# Patient Record
Sex: Male | Born: 1955 | Race: White | Hispanic: No | Marital: Single
Health system: Southern US, Community
[De-identification: ages and names within clinical notes are randomized; demographics above are authoritative.]

## PROBLEM LIST (undated history)

## (undated) DIAGNOSIS — J690 Pneumonitis due to inhalation of food and vomit: Secondary | ICD-10-CM

## (undated) DIAGNOSIS — G931 Anoxic brain damage, not elsewhere classified: Secondary | ICD-10-CM

## (undated) DIAGNOSIS — I469 Cardiac arrest, cause unspecified: Secondary | ICD-10-CM

## (undated) DIAGNOSIS — J9621 Acute and chronic respiratory failure with hypoxia: Secondary | ICD-10-CM

---

## 2020-10-19 ENCOUNTER — Inpatient Hospital Stay
Admission: RE | Admit: 2020-10-19 | Discharge: 2020-11-25 | Disposition: A | Discharge status: Home / Self Care | Payer: Medicare (Managed Care) | Source: Other Acute Inpatient Hospital | Attending: Internal Medicine | Admitting: Internal Medicine

## 2020-10-19 ENCOUNTER — Other Ambulatory Visit (HOSPITAL_COMMUNITY): Payer: Medicare (Managed Care)

## 2020-10-19 DIAGNOSIS — R509 Fever, unspecified: Secondary | ICD-10-CM

## 2020-10-19 DIAGNOSIS — J9690 Respiratory failure, unspecified, unspecified whether with hypoxia or hypercapnia: Secondary | ICD-10-CM

## 2020-10-19 DIAGNOSIS — G931 Anoxic brain damage, not elsewhere classified: Secondary | ICD-10-CM | POA: Diagnosis present

## 2020-10-19 DIAGNOSIS — Z452 Encounter for adjustment and management of vascular access device: Secondary | ICD-10-CM

## 2020-10-19 DIAGNOSIS — I469 Cardiac arrest, cause unspecified: Secondary | ICD-10-CM | POA: Diagnosis present

## 2020-10-19 DIAGNOSIS — I509 Heart failure, unspecified: Secondary | ICD-10-CM

## 2020-10-19 DIAGNOSIS — Z431 Encounter for attention to gastrostomy: Secondary | ICD-10-CM

## 2020-10-19 DIAGNOSIS — J690 Pneumonitis due to inhalation of food and vomit: Secondary | ICD-10-CM | POA: Diagnosis present

## 2020-10-19 DIAGNOSIS — J189 Pneumonia, unspecified organism: Secondary | ICD-10-CM

## 2020-10-19 DIAGNOSIS — J9621 Acute and chronic respiratory failure with hypoxia: Secondary | ICD-10-CM | POA: Diagnosis present

## 2020-10-19 HISTORY — DX: Anoxic brain damage, not elsewhere classified: G93.1

## 2020-10-19 HISTORY — DX: Cardiac arrest, cause unspecified: I46.9

## 2020-10-19 HISTORY — DX: Acute and chronic respiratory failure with hypoxia: J96.21

## 2020-10-19 HISTORY — DX: Pneumonitis due to inhalation of food and vomit: J69.0

## 2020-10-19 LAB — URINALYSIS, ROUTINE W REFLEX MICROSCOPIC
Bacteria, UA: NONE SEEN
Bilirubin Urine: NEGATIVE
Glucose, UA: NEGATIVE mg/dL
Hgb urine dipstick: NEGATIVE
Ketones, ur: 5 mg/dL — AB
Leukocytes,Ua: NEGATIVE
Nitrite: NEGATIVE
Protein, ur: 100 mg/dL — AB
Specific Gravity, Urine: 1.02 (ref 1.005–1.030)
pH: 7 (ref 5.0–8.0)

## 2020-10-19 LAB — BLOOD GAS, ARTERIAL
Acid-Base Excess: 1.7 mmol/L (ref 0.0–2.0)
Bicarbonate: 24.8 mmol/L (ref 20.0–28.0)
FIO2: 40
O2 Saturation: 96 %
Patient temperature: 37
pCO2 arterial: 32.8 mmHg (ref 32.0–48.0)
pH, Arterial: 7.492 — ABNORMAL HIGH (ref 7.350–7.450)
pO2, Arterial: 79.8 mmHg — ABNORMAL LOW (ref 83.0–108.0)

## 2020-10-19 LAB — TSH: TSH: 0.825 u[IU]/mL (ref 0.350–4.500)

## 2020-10-20 DIAGNOSIS — G931 Anoxic brain damage, not elsewhere classified: Secondary | ICD-10-CM

## 2020-10-20 LAB — CBC
HCT: 30.9 % — ABNORMAL LOW (ref 39.0–52.0)
Hemoglobin: 10.2 g/dL — ABNORMAL LOW (ref 13.0–17.0)
MCH: 30.2 pg (ref 26.0–34.0)
MCHC: 33 g/dL (ref 30.0–36.0)
MCV: 91.4 fL (ref 80.0–100.0)
Platelets: 402 10*3/uL — ABNORMAL HIGH (ref 150–400)
RBC: 3.38 MIL/uL — ABNORMAL LOW (ref 4.22–5.81)
RDW: 13.7 % (ref 11.5–15.5)
WBC: 8.9 10*3/uL (ref 4.0–10.5)
nRBC: 0 % (ref 0.0–0.2)

## 2020-10-20 LAB — COMPREHENSIVE METABOLIC PANEL
ALT: 33 U/L (ref 0–44)
AST: 84 U/L — ABNORMAL HIGH (ref 15–41)
Albumin: 2.3 g/dL — ABNORMAL LOW (ref 3.5–5.0)
Alkaline Phosphatase: 136 U/L — ABNORMAL HIGH (ref 38–126)
Anion gap: 12 (ref 5–15)
BUN: 27 mg/dL — ABNORMAL HIGH (ref 8–23)
CO2: 24 mmol/L (ref 22–32)
Calcium: 8.8 mg/dL — ABNORMAL LOW (ref 8.9–10.3)
Chloride: 107 mmol/L (ref 98–111)
Creatinine, Ser: 1.01 mg/dL (ref 0.61–1.24)
GFR, Estimated: >60 mL/min (ref >60)
Glucose, Bld: 131 mg/dL — ABNORMAL HIGH (ref 70–99)
Potassium: 3.8 mmol/L (ref 3.5–5.1)
Sodium: 143 mmol/L (ref 135–145)
Total Bilirubin: 1.2 mg/dL (ref 0.3–1.2)
Total Protein: 5.8 g/dL — ABNORMAL LOW (ref 6.5–8.1)

## 2020-10-20 LAB — PROTIME-INR
INR: 1.5 — ABNORMAL HIGH (ref 0.8–1.2)
Prothrombin Time: 17.8 seconds — ABNORMAL HIGH (ref 11.4–15.2)

## 2020-10-20 NOTE — Consult Note (Signed)
Pulmonary Critical Care Medicine [facility] [place]  PULMONARY SERVICE  Date of Service: 10/20/2020  PULMONARY CRITICAL CARE Richard Campbell  NUU:[id number]  DOB: [birth year]   DOA: 10/19/2020  Referring Physician: Carron Curie, MD  HPI: Richard Campbell is a 64 y.o. male seen for follow up of Acute on Chronic Respiratory Failure.  Patient has multiple medical problems including cardiac arrest respiratory failure aspiration pneumonia presented to the hospital because of cardiac arrest.  Patient apparently has had recurring episodes of cardiac arrest admitted intubated placed on mechanical ventilation.  Had a prolonged course in the hospital required a tracheostomy for failure to wean off the ventilator.  Patient also did have an EEG done which was suggestive of anoxic encephalopathy.  Family wanted ongoing aggressive measures to be pursued so patient ended up with a tracheostomy.  Transferred to our facility for further management.  In addition patient also suffered a aspiration event and had aspiration pneumonia.  Review of Systems:  ROS performed and is unremarkable other than noted above.  Past medical history: Chronic anemia Aspiration pneumonia Cardiac arrest Encephalopathy  Past surgical history: Tracheostomy PEG  Social history: Unknown tobacco alcohol or drug abuse  Family history: Unknown  Review of systems patient is not able to participate  Medications: Reviewed on Rounds  Physical Exam:  Vitals: Temperature is 98.6 pulse 83 respiratory 23 blood pressure is 129/74 saturations 98%  Ventilator Settings assist control FiO2 is 35% tidal volume 500 PEEP 5  . General: Comfortable at this time . Eyes: Grossly normal lids, irises & conjunctiva . ENT: grossly tongue is normal . Neck: no obvious mass . Cardiovascular: S1-S2 normal no gallop or rub . Respiratory: No rhonchi coarse breath sounds . Abdomen: Soft and nontender . Skin: no rash seen on limited  exam . Musculoskeletal: not rigid . Psychiatric:unable to assess . Neurologic: no seizure no involuntary movements         Labs on Admission:  Basic Metabolic Panel: Recent Labs  Lab 10/20/20 0149  NA 143  K 3.8  CL 107  CO2 24  GLUCOSE 131*  BUN 27*  CREATININE 1.01  CALCIUM 8.8*    Recent Labs  Lab 10/19/20 1327  PHART 7.492*  PCO2ART 32.8  PO2ART 79.8*  HCO3 24.8  O2SAT 96.0    Liver Function Tests: Recent Labs  Lab 10/20/20 0149  AST 84*  ALT 33  ALKPHOS 136*  BILITOT 1.2  PROT 5.8*  ALBUMIN 2.3*   No results for input(s): LIPASE, AMYLASE in the last 168 hours. No results for input(s): AMMONIA in the last 168 hours.  CBC: Recent Labs  Lab 10/20/20 0149  WBC 8.9  HGB 10.2*  HCT 30.9*  MCV 91.4  PLT 402*    Cardiac Enzymes: No results for input(s): CKTOTAL, CKMB, CKMBINDEX, TROPONINI in the last 168 hours.  BNP (last 3 results) No results for input(s): BNP in the last 8760 hours.  ProBNP (last 3 results) No results for input(s): PROBNP in the last 8760 hours.   Radiological Exams on Admission: DG Abd 1 View  Result Date: 10/19/2020 CLINICAL DATA:  Peg tube placement EXAM: ABDOMEN - 1 VIEW COMPARISON:  None. FINDINGS: Air and stool-filled nondilated loops of bowel. Mild colonic stool burden diffusely throughout the colon. G-tube projects over the LEFT upper quadrant. Visualized lung bases are unremarkable. Degenerative changes of the lumbar spine with dextroscoliosis of the lumbar spine. IMPRESSION: 1. Nonobstructive bowel gas pattern with mild colonic stool burden. 2. G-tube projects  over the LEFT upper quadrant. Electronically Signed   By: Meda Klinefelter MD   On: 10/19/2020 15:50   DG CHEST PORT 1 VIEW  Result Date: 10/19/2020 CLINICAL DATA:  Respiratory failure. EXAM: PORTABLE CHEST 1 VIEW COMPARISON:  None. FINDINGS: The cardiomediastinal silhouette is enlarged in contour.Tracheostomy. Small LEFT pleural effusion. No pneumothorax.  LEFT retrocardiac opacity. Atherosclerotic calcifications of the aorta. Visualized abdomen is unremarkable. Multilevel degenerative changes of the thoracic spine. IMPRESSION: Small LEFT pleural effusion with LEFT retrocardiac opacity, likely atelectasis. Superimposed infection remains in the differential. Electronically Signed   By: Meda Klinefelter MD   On: 10/19/2020 15:49    Assessment/Plan Active Problems:   Acute on chronic respiratory failure with hypoxia (HCC)   Anoxic encephalopathy (HCC)   Cardiac arrest (HCC)   Aspiration pneumonia (HCC)   1. Acute on chronic respiratory failure hypoxia right now patient remains on the ventilator and full support currently is on assist control on 35% FiO2.  Volumes are acceptable PEEP currently is at 5 neurological status will be the limiting factor as far as being able to be liberated completely from the ventilator. 2. Cardiac arrest right now rhythm is stable we will continue to monitor closely. 3. Aspiration pneumonia treated with antibiotics.  Chest x-ray still with some residual changes 4. Anoxic encephalopathy we will continue to monitor closely family wants all aggressive measures to be done EEG and showing diffuse slowing  I have personally seen and evaluated the patient, evaluated laboratory and imaging results, formulated the assessment and plan and placed orders. The Patient requires high complexity decision making with multiple systems involvement.  Case was discussed on Rounds with the Respiratory Therapy Director and the Respiratory staff Time Spent  Yevonne Pax, MD [facility] Pulmonary Critical Care Medicine Sleep Medicine

## 2020-10-21 LAB — URINE CULTURE: Culture: NO GROWTH

## 2020-10-21 NOTE — Progress Notes (Signed)
Pulmonary Critical Care Medicine [facility] [place]   PULMONARY CRITICAL CARE SERVICE  PROGRESS NOTE  Date of Service: 10/21/2020  Richard Campbell  [id number]:[id number]  DOB: 08/19/1956   DOA: 10/19/2020  Referring Physician: Carron Curie, MD  HPI: Richard Campbell is a 64 y.o. male seen for follow up of Acute on Chronic Respiratory Failure.  Patient currently is on pressure support mode has been on 35% FiO2 currently on 12/5  Medications: Reviewed on Rounds  Physical Exam:  Vitals: Temperature is 98.7 pulse 113 respiratory 28 blood pressure is 150/82 saturations 98%  Ventilator Settings on pressure support with an FiO2 of 35% pressure 12/5  . General: Comfortable at this time . Eyes: Grossly normal lids, irises & conjunctiva . ENT: grossly tongue is normal . Neck: no obvious mass . Cardiovascular: S1 S2 normal no gallop . Respiratory: No rhonchi very coarse breath . Abdomen: soft . Skin: no rash seen on limited exam . Musculoskeletal: not rigid . Psychiatric:unable to assess . Neurologic: no seizure no involuntary movements         Lab Data:   Basic Metabolic Panel: Recent Labs  Lab 10/20/20 0149  NA 143  K 3.8  CL 107  CO2 24  GLUCOSE 131*  BUN 27*  CREATININE 1.01  CALCIUM 8.8*    ABG: Recent Labs  Lab 10/19/20 1327  PHART 7.492*  PCO2ART 32.8  PO2ART 79.8*  HCO3 24.8  O2SAT 96.0    Liver Function Tests: Recent Labs  Lab 10/20/20 0149  AST 84*  ALT 33  ALKPHOS 136*  BILITOT 1.2  PROT 5.8*  ALBUMIN 2.3*   No results for input(s): LIPASE, AMYLASE in the last 168 hours. No results for input(s): AMMONIA in the last 168 hours.  CBC: Recent Labs  Lab 10/20/20 0149  WBC 8.9  HGB 10.2*  HCT 30.9*  MCV 91.4  PLT 402*    Cardiac Enzymes: No results for input(s): CKTOTAL, CKMB, CKMBINDEX, TROPONINI in the last 168 hours.  BNP (last 3 results) No results for input(s): BNP in the last 8760 hours.  ProBNP (last 3 results) No results for  input(s): PROBNP in the last 8760 hours.  Radiological Exams: DG Abd 1 View  Result Date: 10/19/2020 CLINICAL DATA:  Peg tube placement EXAM: ABDOMEN - 1 VIEW COMPARISON:  None. FINDINGS: Air and stool-filled nondilated loops of bowel. Mild colonic stool burden diffusely throughout the colon. G-tube projects over the LEFT upper quadrant. Visualized lung bases are unremarkable. Degenerative changes of the lumbar spine with dextroscoliosis of the lumbar spine. IMPRESSION: 1. Nonobstructive bowel gas pattern with mild colonic stool burden. 2. G-tube projects over the LEFT upper quadrant. Electronically Signed   By: Meda Klinefelter MD   On: 10/19/2020 15:50   DG CHEST PORT 1 VIEW  Result Date: 10/19/2020 CLINICAL DATA:  Respiratory failure. EXAM: PORTABLE CHEST 1 VIEW COMPARISON:  None. FINDINGS: The cardiomediastinal silhouette is enlarged in contour.Tracheostomy. Small LEFT pleural effusion. No pneumothorax. LEFT retrocardiac opacity. Atherosclerotic calcifications of the aorta. Visualized abdomen is unremarkable. Multilevel degenerative changes of the thoracic spine. IMPRESSION: Small LEFT pleural effusion with LEFT retrocardiac opacity, likely atelectasis. Superimposed infection remains in the differential. Electronically Signed   By: Meda Klinefelter MD   On: 10/19/2020 15:49    Assessment/Plan Active Problems:   Acute on chronic respiratory failure with hypoxia (HCC)   Anoxic encephalopathy (HCC)   Cardiac arrest (HCC)   Aspiration pneumonia (HCC)   1. Acute on chronic respiratory failure hypoxia on pressure  support wean 12/5 try to continue to advance to wean 2. Anoxic encephalopathy grossly unchanged 3. Cardiac arrest rhythm has been stable 4. Aspiration pneumonia overall no changes noted   I have personally seen and evaluated the patient, evaluated laboratory and imaging results, formulated the assessment and plan and placed orders. The Patient requires high complexity decision  making with multiple systems involvement.  Rounds were done with the Respiratory Therapy Director and Staff therapists and discussed with nursing staff also.  Yevonne Pax, MD [facility] Pulmonary Critical Care Medicine Sleep Medicine

## 2020-10-22 LAB — CBC
HCT: 33.3 % — ABNORMAL LOW (ref 39.0–52.0)
Hemoglobin: 10.9 g/dL — ABNORMAL LOW (ref 13.0–17.0)
MCH: 29.8 pg (ref 26.0–34.0)
MCHC: 32.7 g/dL (ref 30.0–36.0)
MCV: 91 fL (ref 80.0–100.0)
Platelets: 413 10*3/uL — ABNORMAL HIGH (ref 150–400)
RBC: 3.66 MIL/uL — ABNORMAL LOW (ref 4.22–5.81)
RDW: 13.7 % (ref 11.5–15.5)
WBC: 8.9 10*3/uL (ref 4.0–10.5)
nRBC: 0 % (ref 0.0–0.2)

## 2020-10-22 LAB — COMPREHENSIVE METABOLIC PANEL
ALT: 24 U/L (ref 0–44)
AST: 69 U/L — ABNORMAL HIGH (ref 15–41)
Albumin: 2.4 g/dL — ABNORMAL LOW (ref 3.5–5.0)
Alkaline Phosphatase: 123 U/L (ref 38–126)
Anion gap: 10 (ref 5–15)
BUN: 25 mg/dL — ABNORMAL HIGH (ref 8–23)
CO2: 23 mmol/L (ref 22–32)
Calcium: 8.8 mg/dL — ABNORMAL LOW (ref 8.9–10.3)
Chloride: 109 mmol/L (ref 98–111)
Creatinine, Ser: 1.01 mg/dL (ref 0.61–1.24)
GFR, Estimated: >60 mL/min (ref >60)
Glucose, Bld: 138 mg/dL — ABNORMAL HIGH (ref 70–99)
Potassium: 3.7 mmol/L (ref 3.5–5.1)
Sodium: 142 mmol/L (ref 135–145)
Total Bilirubin: 1 mg/dL (ref 0.3–1.2)
Total Protein: 6 g/dL — ABNORMAL LOW (ref 6.5–8.1)

## 2020-10-22 NOTE — Progress Notes (Signed)
Pulmonary Critical Care Medicine [facility] [place]   PULMONARY CRITICAL CARE SERVICE  PROGRESS NOTE  Date of Service: 10/23/2020  Richard Campbell  [id number]  DOB: 1956-05-12   DOA: 10/19/2020  Referring Physician: Carron Curie, MD  HPI: Richard Campbell is a 64 y.o. male seen for follow up of Acute on Chronic Respiratory Failure.  Patient right now is on pressure support has been on 28% FiO2 with good saturations.  Medications: Reviewed on Rounds  Physical Exam:  Vitals: Temperature is 99.1 pulse 71 respiratory rate is 33 blood pressure is 155/85 saturations 97%  Ventilator Settings on pressure support FiO2 28% pressure of 12/5  . General: Comfortable at this time . Eyes: Grossly normal lids, irises & conjunctiva . ENT: grossly tongue is normal . Neck: no obvious mass . Cardiovascular: S1 S2 normal no gallop . Respiratory: No rhonchi no rales are noted at this time . Abdomen: soft . Skin: no rash seen on limited exam . Musculoskeletal: not rigid . Psychiatric:unable to assess . Neurologic: no seizure no involuntary movements         Lab Data:   Basic Metabolic Panel: Recent Labs  Lab 10/20/20 0149 10/22/20 0326  NA 143 142  K 3.8 3.7  CL 107 109  CO2 24 23  GLUCOSE 131* 138*  BUN 27* 25*  CREATININE 1.01 1.01  CALCIUM 8.8* 8.8*    ABG: Recent Labs  Lab 10/19/20 1327  PHART 7.492*  PCO2ART 32.8  PO2ART 79.8*  HCO3 24.8  O2SAT 96.0    Liver Function Tests: Recent Labs  Lab 10/20/20 0149 10/22/20 0326  AST 84* 69*  ALT 33 24  ALKPHOS 136* 123  BILITOT 1.2 1.0  PROT 5.8* 6.0*  ALBUMIN 2.3* 2.4*   No results for input(s): LIPASE, AMYLASE in the last 168 hours. No results for input(s): AMMONIA in the last 168 hours.  CBC: Recent Labs  Lab 10/20/20 0149 10/22/20 0326  WBC 8.9 8.9  HGB 10.2* 10.9*  HCT 30.9* 33.3*  MCV 91.4 91.0  PLT 402* 413*    Cardiac Enzymes: No results for input(s): CKTOTAL, CKMB, CKMBINDEX, TROPONINI in  the last 168 hours.  BNP (last 3 results) No results for input(s): BNP in the last 8760 hours.  ProBNP (last 3 results) No results for input(s): PROBNP in the last 8760 hours.  Radiological Exams: No results found.  Assessment/Plan Active Problems:   Acute on chronic respiratory failure with hypoxia (HCC)   Anoxic encephalopathy (HCC)   Cardiac arrest (HCC)   Aspiration pneumonia (HCC)   1. Acute on chronic respiratory failure with hypoxia currently is on pressure support patient has been on 28% FiO2 with a goal of 8 hours.  We will continue with pressure support titrate oxygen continue pulmonary toilet. 2. Anoxic encephalopathy remains grossly unchanged 3. Cardiac arrest rhythm is stable 4. Aspiration treated we will continue with supportive care   I have personally seen and evaluated the patient, evaluated laboratory and imaging results, formulated the assessment and plan and placed orders. The Patient requires high complexity decision making with multiple systems involvement.  Rounds were done with the Respiratory Therapy Director and Staff therapists and discussed with nursing staff also.  Yevonne Pax, MD [facility] Pulmonary Critical Care Medicine Sleep Medicine

## 2020-10-23 ENCOUNTER — Encounter: Payer: Self-pay | Admitting: Internal Medicine

## 2020-10-23 DIAGNOSIS — G931 Anoxic brain damage, not elsewhere classified: Secondary | ICD-10-CM | POA: Diagnosis present

## 2020-10-23 DIAGNOSIS — J690 Pneumonitis due to inhalation of food and vomit: Secondary | ICD-10-CM | POA: Diagnosis present

## 2020-10-23 DIAGNOSIS — J9621 Acute and chronic respiratory failure with hypoxia: Secondary | ICD-10-CM | POA: Diagnosis present

## 2020-10-23 DIAGNOSIS — I469 Cardiac arrest, cause unspecified: Secondary | ICD-10-CM | POA: Diagnosis present

## 2020-10-23 NOTE — Progress Notes (Signed)
Pulmonary Critical Care Medicine [facility] [place]   PULMONARY CRITICAL CARE SERVICE  PROGRESS NOTE  Date of Service: 10/23/2020  Richard Campbell  [id number]:[id number]  DOB: 1956-06-16   DOA: 10/19/2020  Referring Physician: Carron Curie, MD  HPI: Richard Campbell is a 64 y.o. male seen for follow up of Acute on Chronic Respiratory Failure. Patient currently is on assist control mode has been on 28% FiO2 remains comfortable right now at baseline.  Medications: Reviewed on Rounds  Physical Exam:  Vitals: Temperature is 98.6 pulse 88 respiratory rate 30 blood pressure is 154/84 saturations 97%  Ventilator Settings on the ventilator assist control mode  . General: Comfortable at this time . Eyes: Grossly normal lids, irises & conjunctiva . ENT: grossly tongue is normal . Neck: no obvious mass . Cardiovascular: S1 S2 normal no gallop . Respiratory: No rhonchi coarse breath sounds . Abdomen: soft . Skin: no rash seen on limited exam . Musculoskeletal: not rigid . Psychiatric:unable to assess . Neurologic: no seizure no involuntary movements         Lab Data:   Basic Metabolic Panel: Recent Labs  Lab 10/20/20 0149 10/22/20 0326  NA 143 142  K 3.8 3.7  CL 107 109  CO2 24 23  GLUCOSE 131* 138*  BUN 27* 25*  CREATININE 1.01 1.01  CALCIUM 8.8* 8.8*    ABG: Recent Labs  Lab 10/19/20 1327  PHART 7.492*  PCO2ART 32.8  PO2ART 79.8*  HCO3 24.8  O2SAT 96.0    Liver Function Tests: Recent Labs  Lab 10/20/20 0149 10/22/20 0326  AST 84* 69*  ALT 33 24  ALKPHOS 136* 123  BILITOT 1.2 1.0  PROT 5.8* 6.0*  ALBUMIN 2.3* 2.4*   No results for input(s): LIPASE, AMYLASE in the last 168 hours. No results for input(s): AMMONIA in the last 168 hours.  CBC: Recent Labs  Lab 10/20/20 0149 10/22/20 0326  WBC 8.9 8.9  HGB 10.2* 10.9*  HCT 30.9* 33.3*  MCV 91.4 91.0  PLT 402* 413*    Cardiac Enzymes: No results for input(s): CKTOTAL, CKMB, CKMBINDEX, TROPONINI in  the last 168 hours.  BNP (last 3 results) No results for input(s): BNP in the last 8760 hours.  ProBNP (last 3 results) No results for input(s): PROBNP in the last 8760 hours.  Radiological Exams: No results found.  Assessment/Plan Active Problems:   Acute on chronic respiratory failure with hypoxia (HCC)   Anoxic encephalopathy (HCC)   Cardiac arrest (HCC)   Aspiration pneumonia (HCC)   1. Acute on chronic respiratory failure hypoxia patient is been weaning on pressure support try to wean again on pressure support as tolerated. Titrate oxygen continue pulmonary toilet. 2. Anoxic encephalopathy overall unchanged EEG showing diffuse slowing 3. Cardiac arrest rhythm stable 4. Aspiration pneumonia treated we will continue to monitor   I have personally seen and evaluated the patient, evaluated laboratory and imaging results, formulated the assessment and plan and placed orders. The Patient requires high complexity decision making with multiple systems involvement.  Rounds were done with the Respiratory Therapy Director and Staff therapists and discussed with nursing staff also.  Yevonne Pax, MD [facility] Pulmonary Critical Care Medicine Sleep Medicine

## 2020-10-24 NOTE — Progress Notes (Signed)
Pulmonary Critical Care Medicine [facility] [place]   PULMONARY CRITICAL CARE SERVICE  PROGRESS NOTE  Date of Service: 10/24/2020  Richard Campbell  GUY:[id number]  DOB: Jun 27, 1956   DOA: 10/19/2020  Referring Physician: Carron Curie, MD  HPI: Richard Campbell is a 64 y.o. male seen for follow up of Acute on Chronic Respiratory Failure.  Patient is on pressure support currently on 20% FiO2 has been on pressure of 12/5  Medications: Reviewed on Rounds  Physical Exam:  Vitals: Temperature is 100.5 pulse 87 respiratory 29 blood pressure is 126/71 saturations 98%  Ventilator Settings on pressure support FiO2 is 28%  . General: Comfortable at this time . Eyes: Grossly normal lids, irises & conjunctiva . ENT: grossly tongue is normal . Neck: no obvious mass . Cardiovascular: S1 S2 normal no gallop . Respiratory: No rhonchi very coarse breath . Abdomen: soft . Skin: no rash seen on limited exam . Musculoskeletal: not rigid . Psychiatric:unable to assess . Neurologic: no seizure no involuntary movements         Lab Data:   Basic Metabolic Panel: Recent Labs  Lab 10/20/20 0149 10/22/20 0326  NA 143 142  K 3.8 3.7  CL 107 109  CO2 24 23  GLUCOSE 131* 138*  BUN 27* 25*  CREATININE 1.01 1.01  CALCIUM 8.8* 8.8*    ABG: Recent Labs  Lab 10/19/20 1327  PHART 7.492*  PCO2ART 32.8  PO2ART 79.8*  HCO3 24.8  O2SAT 96.0    Liver Function Tests: Recent Labs  Lab 10/20/20 0149 10/22/20 0326  AST 84* 69*  ALT 33 24  ALKPHOS 136* 123  BILITOT 1.2 1.0  PROT 5.8* 6.0*  ALBUMIN 2.3* 2.4*   No results for input(s): LIPASE, AMYLASE in the last 168 hours. No results for input(s): AMMONIA in the last 168 hours.  CBC: Recent Labs  Lab 10/20/20 0149 10/22/20 0326  WBC 8.9 8.9  HGB 10.2* 10.9*  HCT 30.9* 33.3*  MCV 91.4 91.0  PLT 402* 413*    Cardiac Enzymes: No results for input(s): CKTOTAL, CKMB, CKMBINDEX, TROPONINI in the last 168 hours.  BNP (last 3  results) No results for input(s): BNP in the last 8760 hours.  ProBNP (last 3 results) No results for input(s): PROBNP in the last 8760 hours.  Radiological Exams: No results found.  Assessment/Plan Active Problems:   Acute on chronic respiratory failure with hypoxia (HCC)   Anoxic encephalopathy (HCC)   Cardiac arrest (HCC)   Aspiration pneumonia (HCC)   1. Acute on chronic respiratory failure hypoxia we will continue with pressure support titrate oxygen continue pulmonary toilet. 2. Anoxic encephalopathy at baseline 3. Cardiac arrest rhythm has been stable 4. Aspiration pneumonia treated   I have personally seen and evaluated the patient, evaluated laboratory and imaging results, formulated the assessment and plan and placed orders. The Patient requires high complexity decision making with multiple systems involvement.  Rounds were done with the Respiratory Therapy Director and Staff therapists and discussed with nursing staff also.  Yevonne Pax, MD [facility] Pulmonary Critical Care Medicine Sleep Medicine

## 2020-10-25 ENCOUNTER — Other Ambulatory Visit (HOSPITAL_COMMUNITY): Payer: Medicare (Managed Care)

## 2020-10-25 NOTE — Progress Notes (Signed)
Pulmonary Critical Care Medicine [facility] [place]   PULMONARY CRITICAL CARE SERVICE  PROGRESS NOTE  Date of Service: 10/25/2020  Stellan Vick  QIH:[id number]  DOB: 1956-01-15   DOA: 10/19/2020  Referring Physician: Carron Curie, MD  HPI: Griff Badley is a 64 y.o. male seen for follow up of Acute on Chronic Respiratory Failure.  Patient is on pressure support has been on 28% FiO2 currently on a pressure of 12 5  Medications: Reviewed on Rounds  Physical Exam:  Vitals: Temperature is 98.3 pulse 84 respiratory rate 20 blood pressure is 139/80 saturations 98%  Ventilator Settings on pressure support FiO2 28% pressure 12/5  . General: Comfortable at this time . Eyes: Grossly normal lids, irises & conjunctiva . ENT: grossly tongue is normal . Neck: no obvious mass . Cardiovascular: S1 S2 normal no gallop . Respiratory: Scattered rhonchi no rales . Abdomen: soft . Skin: no rash seen on limited exam . Musculoskeletal: not rigid . Psychiatric:unable to assess . Neurologic: no seizure no involuntary movements         Lab Data:   Basic Metabolic Panel: Recent Labs  Lab 10/20/20 0149 10/22/20 0326  NA 143 142  K 3.8 3.7  CL 107 109  CO2 24 23  GLUCOSE 131* 138*  BUN 27* 25*  CREATININE 1.01 1.01  CALCIUM 8.8* 8.8*    ABG: Recent Labs  Lab 10/19/20 1327  PHART 7.492*  PCO2ART 32.8  PO2ART 79.8*  HCO3 24.8  O2SAT 96.0    Liver Function Tests: Recent Labs  Lab 10/20/20 0149 10/22/20 0326  AST 84* 69*  ALT 33 24  ALKPHOS 136* 123  BILITOT 1.2 1.0  PROT 5.8* 6.0*  ALBUMIN 2.3* 2.4*   No results for input(s): LIPASE, AMYLASE in the last 168 hours. No results for input(s): AMMONIA in the last 168 hours.  CBC: Recent Labs  Lab 10/20/20 0149 10/22/20 0326  WBC 8.9 8.9  HGB 10.2* 10.9*  HCT 30.9* 33.3*  MCV 91.4 91.0  PLT 402* 413*    Cardiac Enzymes: No results for input(s): CKTOTAL, CKMB, CKMBINDEX, TROPONINI in the last 168  hours.  BNP (last 3 results) No results for input(s): BNP in the last 8760 hours.  ProBNP (last 3 results) No results for input(s): PROBNP in the last 8760 hours.  Radiological Exams: No results found.  Assessment/Plan Active Problems:   Acute on chronic respiratory failure with hypoxia (HCC)   Anoxic encephalopathy (HCC)   Cardiac arrest (HCC)   Aspiration pneumonia (HCC)   1. Acute on chronic respiratory failure hypoxia we will continue with pressure support titrate oxygen continue pulmonary toilet. 2. Anoxic encephalopathy no change 3. Cardiac arrest rhythm has been stable 4. Aspiration pneumonia treated we will continue to monitor   I have personally seen and evaluated the patient, evaluated laboratory and imaging results, formulated the assessment and plan and placed orders. The Patient requires high complexity decision making with multiple systems involvement.  Rounds were done with the Respiratory Therapy Director and Staff therapists and discussed with nursing staff also.  Yevonne Pax, MD [facility] Pulmonary Critical Care Medicine Sleep Medicine

## 2020-10-26 LAB — URINALYSIS, ROUTINE W REFLEX MICROSCOPIC
Bilirubin Urine: NEGATIVE
Glucose, UA: NEGATIVE mg/dL
Hgb urine dipstick: NEGATIVE
Ketones, ur: NEGATIVE mg/dL
Leukocytes,Ua: NEGATIVE
Nitrite: NEGATIVE
Protein, ur: 30 mg/dL — AB
Specific Gravity, Urine: 1.027 (ref 1.005–1.030)
pH: 6 (ref 5.0–8.0)

## 2020-10-26 LAB — URINE CULTURE: Culture: NO GROWTH

## 2020-10-26 NOTE — Progress Notes (Signed)
Pulmonary Critical Care Medicine [facility] [place]   PULMONARY CRITICAL CARE SERVICE  PROGRESS NOTE  Date of Service: 10/26/2020  Richard Campbell  [id number]  DOB: 10/08/1956   DOA: 10/19/2020  Referring Physician: Carron Curie, MD  HPI: Richard Campbell is a 64 y.o. male seen for follow up of Acute on Chronic Respiratory Failure.  Patient currently is on pressure support has been on 28% FiO2 pressure of 12/5  Medications: Reviewed on Rounds  Physical Exam:  Vitals: Temperature is 99.4 pulse 92 respiratory rate 36 blood pressure is 138/79 saturations 96%  Ventilator Settings on pressure support FiO2 28% pressure 12/5  . General: Comfortable at this time . Eyes: Grossly normal lids, irises & conjunctiva . ENT: grossly tongue is normal . Neck: no obvious mass . Cardiovascular: S1 S2 normal no gallop . Respiratory: No rhonchi no rales noted at this time . Abdomen: soft . Skin: no rash seen on limited exam . Musculoskeletal: not rigid . Psychiatric:unable to assess . Neurologic: no seizure no involuntary movements         Lab Data:   Basic Metabolic Panel: Recent Labs  Lab 10/20/20 0149 10/22/20 0326  NA 143 142  K 3.8 3.7  CL 107 109  CO2 24 23  GLUCOSE 131* 138*  BUN 27* 25*  CREATININE 1.01 1.01  CALCIUM 8.8* 8.8*    ABG: Recent Labs  Lab 10/19/20 1327  PHART 7.492*  PCO2ART 32.8  PO2ART 79.8*  HCO3 24.8  O2SAT 96.0    Liver Function Tests: Recent Labs  Lab 10/20/20 0149 10/22/20 0326  AST 84* 69*  ALT 33 24  ALKPHOS 136* 123  BILITOT 1.2 1.0  PROT 5.8* 6.0*  ALBUMIN 2.3* 2.4*   No results for input(s): LIPASE, AMYLASE in the last 168 hours. No results for input(s): AMMONIA in the last 168 hours.  CBC: Recent Labs  Lab 10/20/20 0149 10/22/20 0326  WBC 8.9 8.9  HGB 10.2* 10.9*  HCT 30.9* 33.3*  MCV 91.4 91.0  PLT 402* 413*    Cardiac Enzymes: No results for input(s): CKTOTAL, CKMB, CKMBINDEX, TROPONINI in the last 168  hours.  BNP (last 3 results) No results for input(s): BNP in the last 8760 hours.  ProBNP (last 3 results) No results for input(s): PROBNP in the last 8760 hours.  Radiological Exams: DG CHEST PORT 1 VIEW  Result Date: 10/25/2020 CLINICAL DATA:  Pneumonia. EXAM: PORTABLE CHEST 1 VIEW COMPARISON:  10/19/2020 FINDINGS: Tracheostomy tube in adequate position. Lungs are somewhat hypoinflated demonstrate subtle patchy hazy perihilar opacification which may be due to vascular congestion versus infection as findings are slightly improved. No effusion. Cardiomediastinal silhouette and remainder the exam is unchanged. IMPRESSION: Slight interval improvement of subtle patchy hazy perihilar opacification which may be due to vascular congestion versus infection. Electronically Signed   By: Elberta Fortis M.D.   On: 10/25/2020 16:04    Assessment/Plan Active Problems:   Acute on chronic respiratory failure with hypoxia (HCC)   Anoxic encephalopathy (HCC)   Cardiac arrest (HCC)   Aspiration pneumonia (HCC)   1. Acute on chronic respiratory failure with hypoxia we will continue with pressure support titrate oxygen continue pulmonary toilet 2. Acute encephalopathy no change we will continue to follow 3. Cardiac arrest rhythm is stable 4. Aspiration pneumonia treated we will monitor closely   I have personally seen and evaluated the patient, evaluated laboratory and imaging results, formulated the assessment and plan and placed orders. The Patient requires high complexity decision making  with multiple systems involvement.  Rounds were done with the Respiratory Therapy Director and Staff therapists and discussed with nursing staff also.  Allyne Gee, MD [facility] Pulmonary Critical Care Medicine Sleep Medicine

## 2020-10-27 NOTE — Progress Notes (Signed)
Pulmonary Critical Care Medicine [facility] [place]   PULMONARY CRITICAL CARE SERVICE  PROGRESS NOTE  Date of Service: 10/27/2020  Richard Campbell  [id number]  DOB: 10-17-1956   DOA: 10/19/2020  Referring Physician: Carron Curie, MD  HPI: Richard Campbell is a 64 y.o. male seen for follow up of Acute on Chronic Respiratory Failure.  Patient currently is on pressure support has been on 28% FiO2 pressure of 12/5  Medications: Reviewed on Rounds  Physical Exam:  Vitals: Temperature is 98.7 pulse 82 respiratory 20 blood pressure is 110/82 saturations 97%  Ventilator Settings on pressure support FiO2 28% pressure 12/5  . General: Comfortable at this time . Eyes: Grossly normal lids, irises & conjunctiva . ENT: grossly tongue is normal . Neck: no obvious mass . Cardiovascular: S1 S2 normal no gallop . Respiratory: No rhonchi very coarse breath sounds . Abdomen: soft . Skin: no rash seen on limited exam . Musculoskeletal: not rigid . Psychiatric:unable to assess . Neurologic: no seizure no involuntary movements         Lab Data:   Basic Metabolic Panel: Recent Labs  Lab 10/22/20 0326  NA 142  K 3.7  CL 109  CO2 23  GLUCOSE 138*  BUN 25*  CREATININE 1.01  CALCIUM 8.8*    ABG: No results for input(s): PHART, PCO2ART, PO2ART, HCO3, O2SAT in the last 168 hours.  Liver Function Tests: Recent Labs  Lab 10/22/20 0326  AST 69*  ALT 24  ALKPHOS 123  BILITOT 1.0  PROT 6.0*  ALBUMIN 2.4*   No results for input(s): LIPASE, AMYLASE in the last 168 hours. No results for input(s): AMMONIA in the last 168 hours.  CBC: Recent Labs  Lab 10/22/20 0326  WBC 8.9  HGB 10.9*  HCT 33.3*  MCV 91.0  PLT 413*    Cardiac Enzymes: No results for input(s): CKTOTAL, CKMB, CKMBINDEX, TROPONINI in the last 168 hours.  BNP (last 3 results) No results for input(s): BNP in the last 8760 hours.  ProBNP (last 3 results) No results for input(s): PROBNP in the last 8760  hours.  Radiological Exams: No results found.  Assessment/Plan Active Problems:   Acute on chronic respiratory failure with hypoxia (HCC)   Anoxic encephalopathy (HCC)   Cardiac arrest (HCC)   Aspiration pneumonia (HCC)   1. Acute on chronic respiratory failure with hypoxia we will continue with the pressure support titrate oxygen continue pulmonary toilet the goal is 12 hours 2. Anoxic encephalopathy no change we will continue to follow 3. Cardiac arrest rhythm has been stable 4. Aspiration pneumonia has been treated we will continue to follow closely   I have personally seen and evaluated the patient, evaluated laboratory and imaging results, formulated the assessment and plan and placed orders. The Patient requires high complexity decision making with multiple systems involvement.  Rounds were done with the Respiratory Therapy Director and Staff therapists and discussed with nursing staff also.  Yevonne Pax, MD [facility] Pulmonary Critical Care Medicine Sleep Medicine

## 2020-10-28 ENCOUNTER — Encounter (HOSPITAL_BASED_OUTPATIENT_CLINIC_OR_DEPARTMENT_OTHER): Payer: Medicare (Managed Care)

## 2020-10-28 DIAGNOSIS — M7989 Other specified soft tissue disorders: Secondary | ICD-10-CM

## 2020-10-28 LAB — BLOOD GAS, ARTERIAL
Acid-base deficit: 2.3 mmol/L — ABNORMAL HIGH (ref 0.0–2.0)
Bicarbonate: 21 mmol/L (ref 20.0–28.0)
FIO2: 28
O2 Saturation: 82.5 %
Patient temperature: 37.2
pCO2 arterial: 30.7 mmHg — ABNORMAL LOW (ref 32.0–48.0)
pH, Arterial: 7.452 — ABNORMAL HIGH (ref 7.350–7.450)
pO2, Arterial: 49 mmHg — ABNORMAL LOW (ref 83.0–108.0)

## 2020-10-28 NOTE — Progress Notes (Signed)
Right upper ext venous US completed.  Results given to Patients Physician Dr. Sharyon Medicus.    Please see CV Proc for preliminary results.   Clint Guy, RVT

## 2020-10-28 NOTE — Progress Notes (Signed)
Pulmonary Critical Care Medicine [facility] [place]   PULMONARY CRITICAL CARE SERVICE  PROGRESS NOTE  Date of Service: 10/28/2020  Richard Campbell  [id number]  DOB: [birth year]   DOA: 10/19/2020  Referring Physician: Carron Curie, MD  HPI: Richard Campbell is a 64 y.o. male seen for follow up of Acute on Chronic Respiratory Failure.  Patient currently is on assist control has been on 28% FiO2 with PEEP 5  Medications: Reviewed on Rounds  Physical Exam:  Vitals: Temperature is 99.0 pulse 92 respiratory 30 blood pressure 129/60 saturations 96%  Ventilator Settings on assist control FiO2 is 28% tidal volume 500 PEEP 5   General: Comfortable at this time  Eyes: Grossly normal lids, irises & conjunctiva  ENT: grossly tongue is normal  Neck: no obvious mass  Cardiovascular: S1 S2 normal no gallop  Respiratory: Scattered rhonchi expansion is equal  Abdomen: soft  Skin: no rash seen on limited exam  Musculoskeletal: not rigid  Psychiatric:unable to assess  Neurologic: no seizure no involuntary movements         Lab Data:   Basic Metabolic Panel: Recent Labs  Lab 10/22/20 0326  NA 142  K 3.7  CL 109  CO2 23  GLUCOSE 138*  BUN 25*  CREATININE 1.01  CALCIUM 8.8*    ABG: No results for input(s): PHART, PCO2ART, PO2ART, HCO3, O2SAT in the last 168 hours.  Liver Function Tests: Recent Labs  Lab 10/22/20 0326  AST 69*  ALT 24  ALKPHOS 123  BILITOT 1.0  PROT 6.0*  ALBUMIN 2.4*   No results for input(s): LIPASE, AMYLASE in the last 168 hours. No results for input(s): AMMONIA in the last 168 hours.  CBC: Recent Labs  Lab 10/22/20 0326  WBC 8.9  HGB 10.9*  HCT 33.3*  MCV 91.0  PLT 413*    Cardiac Enzymes: No results for input(s): CKTOTAL, CKMB, CKMBINDEX, TROPONINI in the last 168 hours.  BNP (last 3 results) No results for input(s): BNP in the last 8760 hours.  ProBNP (last 3 results) No results for input(s): PROBNP in the last 8760  hours.  Radiological Exams: No results found.  Assessment/Plan Active Problems:   Acute on chronic respiratory failure with hypoxia (HCC)   Anoxic encephalopathy (HCC)   Cardiac arrest (HCC)   Aspiration pneumonia (HCC)   1. Acute on chronic respiratory failure hypoxia we will continue with full support on the ventilator.  Right now is on assist control mode 2. Anoxic encephalopathy grossly unchanged 3. Cardiac arrest rhythm stable 4. Aspiration pneumonia treated we will continue to monitor   I have personally seen and evaluated the patient, evaluated laboratory and imaging results, formulated the assessment and plan and placed orders. The Patient requires high complexity decision making with multiple systems involvement.  Rounds were done with the Respiratory Therapy Director and Staff therapists and discussed with nursing staff also.  Yevonne Pax, MD [facility] Pulmonary Critical Care Medicine Sleep Medicine

## 2020-10-29 ENCOUNTER — Other Ambulatory Visit (HOSPITAL_COMMUNITY): Payer: Medicare (Managed Care)

## 2020-10-29 LAB — BASIC METABOLIC PANEL
Anion gap: 10 (ref 5–15)
BUN: 22 mg/dL (ref 8–23)
CO2: 21 mmol/L — ABNORMAL LOW (ref 22–32)
Calcium: 8.5 mg/dL — ABNORMAL LOW (ref 8.9–10.3)
Chloride: 107 mmol/L (ref 98–111)
Creatinine, Ser: 0.84 mg/dL (ref 0.61–1.24)
GFR, Estimated: >60 mL/min (ref >60)
Glucose, Bld: 143 mg/dL — ABNORMAL HIGH (ref 70–99)
Potassium: 3.6 mmol/L (ref 3.5–5.1)
Sodium: 138 mmol/L (ref 135–145)

## 2020-10-29 LAB — VANCOMYCIN, TROUGH: Vancomycin Tr: <4 ug/mL — ABNORMAL LOW (ref 15–20)

## 2020-10-29 NOTE — Progress Notes (Signed)
Pulmonary Critical Care Medicine [facility] [place]   PULMONARY CRITICAL CARE SERVICE  PROGRESS NOTE  Date of Service: 10/29/2020  Richard Campbell  [id number]  DOB: [birth year]   DOA: 10/19/2020  Referring Physician: Carron Curie, MD  HPI: Richard Campbell is a 64 y.o. male seen for follow up of Acute on Chronic Respiratory Failure.  Patient is on assist control mode right now on 50% FiO2 good saturations are noted  Medications: Reviewed on Rounds  Physical Exam:  Vitals: Temperature is 99.4 pulse 98 respiratory rate 45 blood pressure is 138/73 saturations 95%  Ventilator Settings on assist control FiO2 is 50% tidal volume 500 PEEP 5  . General: Comfortable at this time . Eyes: Grossly normal lids, irises & conjunctiva . ENT: grossly tongue is normal . Neck: no obvious mass . Cardiovascular: S1 S2 normal no gallop . Respiratory: Scattered rhonchi coarse breath sound . Abdomen: soft . Skin: no rash seen on limited exam . Musculoskeletal: not rigid . Psychiatric:unable to assess . Neurologic: no seizure no involuntary movements         Lab Data:   Basic Metabolic Panel: Recent Labs  Lab 10/29/20 0418  NA 138  K 3.6  CL 107  CO2 21*  GLUCOSE 143*  BUN 22  CREATININE 0.84  CALCIUM 8.5*    ABG: Recent Labs  Lab 10/28/20 1750  PHART 7.452*  PCO2ART 30.7*  PO2ART 49.0*  HCO3 21.0  O2SAT 82.5    Liver Function Tests: No results for input(s): AST, ALT, ALKPHOS, BILITOT, PROT, ALBUMIN in the last 168 hours. No results for input(s): LIPASE, AMYLASE in the last 168 hours. No results for input(s): AMMONIA in the last 168 hours.  CBC: No results for input(s): WBC, NEUTROABS, HGB, HCT, MCV, PLT in the last 168 hours.  Cardiac Enzymes: No results for input(s): CKTOTAL, CKMB, CKMBINDEX, TROPONINI in the last 168 hours.  BNP (last 3 results) No results for input(s): BNP in the last 8760 hours.  ProBNP (last 3 results) No results for input(s): PROBNP in  the last 8760 hours.  Radiological Exams: VAS [place] UPPER EXTREMITY VENOUS DUPLEX  Result Date: 10/28/2020 UPPER VENOUS STUDY  Indications: Swelling Limitations: Line and Patient vented and would not allow right arm to be rotated outward. Performing Technologist: Clint Guy RVT  Examination Guidelines: A complete evaluation includes B-mode imaging, spectral Doppler, color Doppler, and power Doppler as needed of all accessible portions of each vessel. Bilateral testing is considered an integral part of a complete examination. Limited examinations for reoccurring indications may be performed as noted.  Right Findings: +----------+------------+---------+-----------+----------+-------+ RIGHT     CompressiblePhasicitySpontaneousPropertiesSummary +----------+------------+---------+-----------+----------+-------+ IJV           Full       Yes       Yes                      +----------+------------+---------+-----------+----------+-------+ Subclavian               Yes       Yes                      +----------+------------+---------+-----------+----------+-------+ Axillary      Full       Yes       Yes                      +----------+------------+---------+-----------+----------+-------+ Brachial      None  Acute  +----------+------------+---------+-----------+----------+-------+ Radial        Full                                          +----------+------------+---------+-----------+----------+-------+ Ulnar         Full                                          +----------+------------+---------+-----------+----------+-------+ Cephalic      None                                   Acute  +----------+------------+---------+-----------+----------+-------+ Basilic       None                                   Acute  +----------+------------+---------+-----------+----------+-------+  Summary:  Right: Findings consistent with acute  deep vein thrombosis involving the right brachial vein. Findings consistent with acute superficial vein thrombosis involving the right basilic vein and right cephalic vein.  *See table(s) above for measurements and observations.  Diagnosing physician: Fabienne Bruns MD Electronically signed by Fabienne Bruns MD on 10/28/2020 at 5:22:34 PM.    Final     Assessment/Plan Active Problems:   Acute on chronic respiratory failure with hypoxia (HCC)   Anoxic encephalopathy (HCC)   Cardiac arrest (HCC)   Aspiration pneumonia (HCC)   1. Acute on chronic respiratory failure hypoxia we will continue with assist control titrate oxygen continue pulmonary toilet 2. Anoxic encephalopathy posturing no improvement 3. Cardiac arrest rhythm right now stable 4. Aspiration pneumonia treated 5. DVT noted in the upper extremity   I have personally seen and evaluated the patient, evaluated laboratory and imaging results, formulated the assessment and plan and placed orders. The Patient requires high complexity decision making with multiple systems involvement.  Rounds were done with the Respiratory Therapy Director and Staff therapists and discussed with nursing staff also.  Yevonne Pax, MD [facility] Pulmonary Critical Care Medicine Sleep Medicine

## 2020-10-30 ENCOUNTER — Other Ambulatory Visit (HOSPITAL_COMMUNITY): Payer: Medicare (Managed Care)

## 2020-10-30 DIAGNOSIS — J690 Pneumonitis due to inhalation of food and vomit: Secondary | ICD-10-CM | POA: Diagnosis not present

## 2020-10-30 DIAGNOSIS — G931 Anoxic brain damage, not elsewhere classified: Secondary | ICD-10-CM | POA: Diagnosis not present

## 2020-10-30 DIAGNOSIS — J9621 Acute and chronic respiratory failure with hypoxia: Secondary | ICD-10-CM | POA: Diagnosis not present

## 2020-10-30 DIAGNOSIS — I469 Cardiac arrest, cause unspecified: Secondary | ICD-10-CM | POA: Diagnosis not present

## 2020-10-30 LAB — CBC
HCT: 26.5 % — ABNORMAL LOW (ref 39.0–52.0)
Hemoglobin: 8.8 g/dL — ABNORMAL LOW (ref 13.0–17.0)
MCH: 30 pg (ref 26.0–34.0)
MCHC: 33.2 g/dL (ref 30.0–36.0)
MCV: 90.4 fL (ref 80.0–100.0)
Platelets: 310 10*3/uL (ref 150–400)
RBC: 2.93 MIL/uL — ABNORMAL LOW (ref 4.22–5.81)
RDW: 13.6 % (ref 11.5–15.5)
WBC: 9.9 10*3/uL (ref 4.0–10.5)
nRBC: 0 % (ref 0.0–0.2)

## 2020-10-30 LAB — CULTURE, BLOOD (ROUTINE X 2)
Culture: NO GROWTH
Culture: NO GROWTH

## 2020-10-30 LAB — BASIC METABOLIC PANEL
Anion gap: 10 (ref 5–15)
BUN: 21 mg/dL (ref 8–23)
CO2: 24 mmol/L (ref 22–32)
Calcium: 8.4 mg/dL — ABNORMAL LOW (ref 8.9–10.3)
Chloride: 104 mmol/L (ref 98–111)
Creatinine, Ser: 0.86 mg/dL (ref 0.61–1.24)
GFR, Estimated: >60 mL/min (ref >60)
Glucose, Bld: 162 mg/dL — ABNORMAL HIGH (ref 70–99)
Potassium: 3.2 mmol/L — ABNORMAL LOW (ref 3.5–5.1)
Sodium: 138 mmol/L (ref 135–145)

## 2020-10-30 LAB — CULTURE, RESPIRATORY W GRAM STAIN

## 2020-10-30 LAB — BLOOD GAS, ARTERIAL
Acid-Base Excess: 1.2 mmol/L (ref 0.0–2.0)
Bicarbonate: 24.1 mmol/L (ref 20.0–28.0)
FIO2: 40
O2 Saturation: 93.1 %
Patient temperature: 37.4
pCO2 arterial: 31.2 mmHg — ABNORMAL LOW (ref 32.0–48.0)
pH, Arterial: 7.502 — ABNORMAL HIGH (ref 7.350–7.450)
pO2, Arterial: 67 mmHg — ABNORMAL LOW (ref 83.0–108.0)

## 2020-10-30 NOTE — Progress Notes (Signed)
Pulmonary Critical Care Medicine [facility] [place]   PULMONARY CRITICAL CARE SERVICE  PROGRESS NOTE  Date of Service: 10/30/2020  Richard Campbell  [id number]  DOB: [birth year]   DOA: 10/19/2020  Referring Physician: Carron Curie, MD  HPI: Richard Campbell is a 64 y.o. male seen for follow up of Acute on Chronic Respiratory Failure.  Right now patient is on full support on assist control supposed to start with T collar wean today  Medications: Reviewed on Rounds  Physical Exam:  Vitals: Temperature is 99.1 pulse 112 respiratory rate 31 blood pressure is 126/77 saturations 98%  Ventilator Settings on assist control patient will be attempted on T collar  . General: Comfortable at this time . Eyes: Grossly normal lids, irises & conjunctiva . ENT: grossly tongue is normal . Neck: no obvious mass . Cardiovascular: S1 S2 normal no gallop . Respiratory: No rhonchi no rales noted at this time . Abdomen: soft . Skin: no rash seen on limited exam . Musculoskeletal: not rigid . Psychiatric:unable to assess . Neurologic: no seizure no involuntary movements         Lab Data:   Basic Metabolic Panel: Recent Labs  Lab 10/29/20 0418 10/30/20 0343  NA 138 138  K 3.6 3.2*  CL 107 104  CO2 21* 24  GLUCOSE 143* 162*  BUN 22 21  CREATININE 0.84 0.86  CALCIUM 8.5* 8.4*    ABG: Recent Labs  Lab 10/28/20 1750  PHART 7.452*  PCO2ART 30.7*  PO2ART 49.0*  HCO3 21.0  O2SAT 82.5    Liver Function Tests: No results for input(s): AST, ALT, ALKPHOS, BILITOT, PROT, ALBUMIN in the last 168 hours. No results for input(s): LIPASE, AMYLASE in the last 168 hours. No results for input(s): AMMONIA in the last 168 hours.  CBC: Recent Labs  Lab 10/30/20 0343  WBC 9.9  HGB 8.8*  HCT 26.5*  MCV 90.4  PLT 310    Cardiac Enzymes: No results for input(s): CKTOTAL, CKMB, CKMBINDEX, TROPONINI in the last 168 hours.  BNP (last 3 results) No results for input(s): BNP in the last  8760 hours.  ProBNP (last 3 results) No results for input(s): PROBNP in the last 8760 hours.  Radiological Exams: DG Chest 1 View  Result Date: 10/30/2020 CLINICAL DATA:  Respiratory failure EXAM: CHEST  1 VIEW COMPARISON:  10/29/2020 FINDINGS: Tracheostomy and right internal jugular central venous catheter with its tip within the superior vena cava are unchanged. Pulmonary insufflation is stable, though lung volumes are slightly small. Superimposed extensive bilateral perihilar and lower lung zone pulmonary infiltrate persists, slightly progressive at the right lung base, likely infectious or inflammatory in etiology. No pneumothorax or pleural effusion. Cardiac size within normal limits. Pulmonary vascularity normal. No acute bone abnormality. IMPRESSION: Stable support tubes and lines. Preserved pulmonary insufflation. Progressive bibasilar pulmonary infiltrate, likely infectious in the appropriate clinical setting. Electronically Signed   By: Helyn Numbers MD   On: 10/30/2020 06:26   DG Chest Port 1 View  Result Date: 10/29/2020 CLINICAL DATA:  Encounter for central line placement. EXAM: PORTABLE CHEST 1 VIEW COMPARISON:  October 29, 2020 FINDINGS: Interval placement of RIGHT-sided central venous access device, entering via IJ approach terminating at the upper portion of the RIGHT atrium. Tracheostomy tube in situ, tip between clavicular heads. Cardiomediastinal contours and hilar structures are stable accounting for degree of rotation which is similar to the prior exam. Patchy bilateral parenchymal opacities and obscured LEFT hemidiaphragm as on the previous exam. No visible pneumothorax.  On limited assessment no acute skeletal process. IMPRESSION: 1. Interval placement of RIGHT-sided central venous access device, entering via IJ approach terminating at the upper portion of the RIGHT atrium. 2. Persistent signs of suspected multifocal pneumonia perhaps associated with small LEFT effusion. 3. No  visible pneumothorax. Electronically Signed   By: Donzetta Kohut M.D.   On: 10/29/2020 18:27   DG Chest Port 1 View  Result Date: 10/29/2020 CLINICAL DATA:  Respiratory failure. EXAM: PORTABLE CHEST 1 VIEW COMPARISON:  October 25, 2020. FINDINGS: Stable cardiomediastinal silhouette. Tracheostomy tube is unchanged in position. No pneumothorax is noted. Increased bilateral lung opacities are noted concerning for worsening multifocal pneumonia. Small bilateral pleural effusions may be present. Bony thorax is unremarkable. IMPRESSION: Worsening bilateral lung opacities are noted concerning for worsening multifocal pneumonia. Electronically Signed   By: Lupita Raider M.D.   On: 10/29/2020 15:36   VAS [place] UPPER EXTREMITY VENOUS DUPLEX  Result Date: 10/28/2020 UPPER VENOUS STUDY  Indications: Swelling Limitations: Line and Patient vented and would not allow right arm to be rotated outward. Performing Technologist: Clint Guy RVT  Examination Guidelines: A complete evaluation includes B-mode imaging, spectral Doppler, color Doppler, and power Doppler as needed of all accessible portions of each vessel. Bilateral testing is considered an integral part of a complete examination. Limited examinations for reoccurring indications may be performed as noted.  Right Findings: +----------+------------+---------+-----------+----------+-------+ RIGHT     CompressiblePhasicitySpontaneousPropertiesSummary +----------+------------+---------+-----------+----------+-------+ IJV           Full       Yes       Yes                      +----------+------------+---------+-----------+----------+-------+ Subclavian               Yes       Yes                      +----------+------------+---------+-----------+----------+-------+ Axillary      Full       Yes       Yes                      +----------+------------+---------+-----------+----------+-------+ Brachial      None                                    Acute  +----------+------------+---------+-----------+----------+-------+ Radial        Full                                          +----------+------------+---------+-----------+----------+-------+ Ulnar         Full                                          +----------+------------+---------+-----------+----------+-------+ Cephalic      None                                   Acute  +----------+------------+---------+-----------+----------+-------+ Basilic       None  Acute  +----------+------------+---------+-----------+----------+-------+  Summary:  Right: Findings consistent with acute deep vein thrombosis involving the right brachial vein. Findings consistent with acute superficial vein thrombosis involving the right basilic vein and right cephalic vein.  *See table(s) above for measurements and observations.  Diagnosing physician: Fabienne Bruns MD Electronically signed by Fabienne Bruns MD on 10/28/2020 at 5:22:34 PM.    Final     Assessment/Plan Active Problems:   Acute on chronic respiratory failure with hypoxia (HCC)   Anoxic encephalopathy (HCC)   Cardiac arrest (HCC)   Aspiration pneumonia (HCC)   1. Acute on chronic respiratory failure with hypoxia we will continue with the weaning process try T collar today. 2. Anoxic encephalopathy no change 3. Cardiac arrest rhythm stable 4. Aspiration pneumonia treated we will continue to follow along   I have personally seen and evaluated the patient, evaluated laboratory and imaging results, formulated the assessment and plan and placed orders. The Patient requires high complexity decision making with multiple systems involvement.  Rounds were done with the Respiratory Therapy Director and Staff therapists and discussed with nursing staff also.  Yevonne Pax, MD [facility] Pulmonary Critical Care Medicine Sleep Medicine

## 2020-10-31 DIAGNOSIS — J690 Pneumonitis due to inhalation of food and vomit: Secondary | ICD-10-CM | POA: Diagnosis not present

## 2020-10-31 DIAGNOSIS — I469 Cardiac arrest, cause unspecified: Secondary | ICD-10-CM | POA: Diagnosis not present

## 2020-10-31 DIAGNOSIS — G931 Anoxic brain damage, not elsewhere classified: Secondary | ICD-10-CM | POA: Diagnosis not present

## 2020-10-31 DIAGNOSIS — J9621 Acute and chronic respiratory failure with hypoxia: Secondary | ICD-10-CM | POA: Diagnosis not present

## 2020-10-31 LAB — POTASSIUM: Potassium: 3.3 mmol/L — ABNORMAL LOW (ref 3.5–5.1)

## 2020-10-31 NOTE — Consult Note (Signed)
Infectious Disease Consultation   Richard Campbell  [id number]:[id number]  DOB: 04-24-1956  DOA: 10/19/2020  Requesting physician: Dr. Sharyon Medicus  Reason for consultation: Antibiotic recommendations  History of Present Illness: Richard Campbell is an 64 y.o. 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  He apparently had recurrent episodes of cardiac arrest and had to be intubated and placed on mechanical ventilation.  He also had aspiration pneumonia.  He was unable to be weaned from the ventilator therefore required tracheostomy on 10/17/2020.  Apparently his mental status did not improve.  EEG was done and it was suggestive of anoxic encephalopathy.  He had PEG tube placed.  He also had shock liver at the acute facility, episode of aspiration with aspiration pneumonia. He received treatment with antibiotics including Zosyn.  He was transferred and admitted to [facility] on 10/19/2020.  He had cultures collected on 10/25/2020, blood cultures no growth to date.  However, respiratory cultures collected on 10/25/2020 showed abundant WBC on Gram stain, cultures showing Pseudomonas putida.  He is on 35% FiO2, 5 of PEEP, having increased secretions per nursing.   Review of Systems:  Unable to obtain review of systems at this time  Past Medical History: Past Medical History:  Diagnosis Date  . Acute on chronic respiratory failure with hypoxia (HCC)   . Anoxic encephalopathy (HCC)   . Aspiration pneumonia (HCC)   . Cardiac arrest [facility])     Past Surgical History: Tracheostomy, PEG tube placement  Allergies: No known drug allergies  Social History: Unable to obtain at this time  Family History: Unable to obtain at this time  Physical Exam: Vitals: Temperature 99.6, T-max 100.4, pulse 91, respiratory rate 32, blood pressure 127/69, pulse oximetry 93% Constitutional: Ill-appearing male, encephalopathic  Head:  Atraumatic, normocephalic Eyes: Pupils reactive ENMT: external ears and nose appear normal, Lips appears normal, moist oral mucosa  Neck: Has trach in place CVS: S1-S2   Respiratory: Coarse breath sounds, rhonchi Abdomen: Soft, positive bowel sounds  Musculoskeletal: he has stiffness and posturing of upper extremities, no lower extremity edema Neuro: Encephalopathic, not following any commands at this time Psych: Unable to assess at this time Skin: no rashes  Data reviewed:  I have personally reviewed following labs and imaging studies Labs:  CBC: Recent Labs  Lab 10/30/20 0343  WBC 9.9  HGB 8.8*  HCT 26.5*  MCV 90.4  PLT 310    Basic Metabolic Panel: Recent Labs  Lab 10/29/20 0418 10/30/20 0343 10/31/20 0325  NA 138 138  --   K 3.6 3.2* 3.3*  CL 107 104  --   CO2 21* 24  --   GLUCOSE 143* 162*  --   BUN 22 21  --   CREATININE 0.84 0.86  --   CALCIUM 8.5* 8.4*  --    GFR CrCl cannot be calculated (Unknown ideal weight.). Liver Function Tests: No results for input(s): AST, ALT, ALKPHOS, BILITOT, PROT, ALBUMIN in the last 168 hours. No results for input(s): LIPASE, AMYLASE in the last 168 hours. No results for input(s): AMMONIA in the last 168 hours. Coagulation profile No results for input(s): INR, PROTIME in the last 168 hours.  Cardiac Enzymes: No results for input(s): CKTOTAL, CKMB, CKMBINDEX, TROPONINI in the last 168 hours. BNP: Invalid input(s): POCBNP CBG: No results for input(s): GLUCAP in the last 168 hours. D-Dimer No results for input(s): DDIMER in the  last 72 hours. Hgb A1c No results for input(s): HGBA1C in the last 72 hours. Lipid Profile No results for input(s): CHOL, HDL, LDLCALC, TRIG, CHOLHDL, LDLDIRECT in the last 72 hours. Thyroid function studies No results for input(s): TSH, T4TOTAL, T3FREE, THYROIDAB in the last 72 hours.  Invalid input(s): FREET3 Anemia work up No results for input(s): VITAMINB12, FOLATE, FERRITIN, TIBC, IRON,  RETICCTPCT in the last 72 hours. Urinalysis    Component Value Date/Time   COLORURINE AMBER (A) 10/25/2020 0009   APPEARANCEUR CLEAR 10/25/2020 0009   LABSPEC 1.027 10/25/2020 0009   PHURINE 6.0 10/25/2020 0009   GLUCOSEU NEGATIVE 10/25/2020 0009   HGBUR NEGATIVE 10/25/2020 0009   BILIRUBINUR NEGATIVE 10/25/2020 0009   KETONESUR NEGATIVE 10/25/2020 0009   PROTEINUR 30 (A) 10/25/2020 0009   NITRITE NEGATIVE 10/25/2020 0009   LEUKOCYTESUR NEGATIVE 10/25/2020 0009     Sepsis Labs Invalid input(s): PROCALCITONIN,  WBC,  LACTICIDVEN Microbiology Recent Results (from the past 240 hour(s))  Culture, blood (routine x 2)     Status: None   Collection Time: 10/25/20  1:34 PM   Specimen: BLOOD LEFT HAND  Result Value Ref Range Status   Specimen Description BLOOD LEFT HAND  Final   Special Requests   Final    BOTTLES DRAWN AEROBIC AND ANAEROBIC Blood Culture results may not be optimal due to an excessive volume of blood received in culture bottles   Culture   Final    NO GROWTH 5 DAYS Performed at [facility], [address], [place], [place] [address]    Report Status 10/30/2020 FINAL  Final  Culture, blood (routine x 2)     Status: None   Collection Time: 10/25/20  1:41 PM   Specimen: BLOOD RIGHT HAND  Result Value Ref Range Status   Specimen Description BLOOD RIGHT HAND  Final   Special Requests   Final    BOTTLES DRAWN AEROBIC AND ANAEROBIC Blood Culture results may not be optimal due to an excessive volume of blood received in culture bottles   Culture   Final    NO GROWTH 5 DAYS Performed at [facility], [address] [place], [place] [address]    Report Status 10/30/2020 FINAL  Final  Culture, respiratory (non-expectorated)     Status: None   Collection Time: 10/25/20  2:30 PM   Specimen: Tracheal Aspirate; Respiratory  Result Value Ref Range Status   Specimen Description TRACHEAL ASPIRATE  Final   Special Requests NONE  Final   Gram Stain   Final     ABUNDANT WBC PRESENT, PREDOMINANTLY PMN ABUNDANT GRAM POSITIVE COCCI ABUNDANT GRAM NEGATIVE RODS FEW YEAST Performed at [facility], [address], [place], [place] [address]    Culture MODERATE PSEUDOMONAS PUTIDA  Final   Report Status 10/30/2020 FINAL  Final   Organism ID, Bacteria PSEUDOMONAS PUTIDA  Final      Susceptibility   Pseudomonas putida - MIC*    CEFTAZIDIME 4 SENSITIVE Sensitive     CIPROFLOXACIN <=0.25 SENSITIVE Sensitive     GENTAMICIN <=1 SENSITIVE Sensitive     IMIPENEM 2 SENSITIVE Sensitive     PIP/TAZO 8 SENSITIVE Sensitive     * MODERATE PSEUDOMONAS PUTIDA  Culture, Urine     Status: None   Collection Time: 10/26/20 12:13 AM   Specimen: Urine, Random  Result Value Ref Range Status   Specimen Description URINE, RANDOM  Final   Special Requests NONE  Final   Culture  Final    NO GROWTH Performed at [facility], [address] [place], [place] [id number]    Report Status 10/26/2020 FINAL  Final     Inpatient Medications:   Please see MAR  Radiological Exams on Admission: DG Chest 1 View  Result Date: 10/30/2020 CLINICAL DATA:  Respiratory failure EXAM: CHEST  1 VIEW COMPARISON:  10/29/2020 FINDINGS: Tracheostomy and right internal jugular central venous catheter with its tip within the superior vena cava are unchanged. Pulmonary insufflation is stable, though lung volumes are slightly small. Superimposed extensive bilateral perihilar and lower lung zone pulmonary infiltrate persists, slightly progressive at the right lung base, likely infectious or inflammatory in etiology. No pneumothorax or pleural effusion. Cardiac size within normal limits. Pulmonary vascularity normal. No acute bone abnormality. IMPRESSION: Stable support tubes and lines. Preserved pulmonary insufflation. Progressive bibasilar pulmonary infiltrate, likely infectious in the appropriate clinical setting. Electronically Signed   By: Helyn NumbersAshesh  Parikh MD   On: 10/30/2020 06:26     Impression/Recommendations Active Problems:   Acute on chronic respiratory failure with hypoxia, vent dependent Pneumonia with Pseudomonas Recurrent aspiration pneumonia Systemic inflammatory response syndrome with fever   Anoxic encephalopathy  Status post cardiac arrest Transaminitis with shock liver Dysphagia/protein calorie malnutrition  Acute on chronic respiratory failure with hypoxemia: He status post cardiopulmonary arrest and has been intubated since his cardiac arrest.  Currently has a trach in place.  Remains on the ventilator.  Unfortunately he has anoxic encephalopathy and likely has ongoing aspiration and very high risk for recurrent aspiration pneumonia.  He has received treatment with multiple antibiotics including treatment with Zosyn.  Now on IV vancomycin, cefepime, empiric fluconazole.  Recent respiratory culture showing Pseudomonas.  Suggest to discontinue the IV vancomycin.  Based on the susceptibility results the Pseudomonas has a lower MIC to ciprofloxacin.  Therefore suggest to switch to ciprofloxacin.  We will plan to treat for duration of 1 week pending improvement.  However, because of his anoxic encephalopathy, dysphagia, chronic trach, high risk for ongoing aspiration he is at risk for recurrent tracheobronchitis, worsening aspiration pneumonia pneumonia despite being on antibiotics.  If respiratory status worsens suggest repeating chest imaging preferably chest CT if he can tolerate.  Pneumonia: As mentioned above respiratory culture showing Pseudomonas putida.  Antibiotics and plan as mentioned above.  Unfortunately he is at risk for recurrent aspiration and aspiration pneumonia despite being on antibiotics.  Please monitor BUN/creatinine closely while on antibiotics and adjust dose accordingly.  Systemic inflammatory response syndrome: Patient continuing to have low-grade fevers which is highly concerning for ongoing aspiration and recurrent aspiration pneumonia.   Antibiotics as mentioned above.  Fortunately blood cultures do not show any growth to date.  If he starts having fevers greater than 101 we may need to consider repeating pancultures.  Anoxic encephalopathy: Patient unfortunately had cardiopulmonary arrest and as a result had anoxic encephalopathy.  He was started on empiric acyclovir for possible encephalitis which she is completing soon. He continues to remain unresponsive.  Continue supportive management per the primary team.  Transaminitis: He had elevated LFTs likely secondary to shock liver.  Continue to monitor LFTs closely.  Further management per the primary team.  Dysphagia/protein calorie malnutrition: Due to his dysphagia he is high risk for aspiration and recurrent aspiration pneumonia.  Has PEG tube for nutrition.  Unfortunately due to his complex medical problems he is very high risk for worsening and decompensation.  Overall poor prognosis since he continues to remain encephalopathic without much  neurologic improvement.  Thank you for this consultation.  Plan of care discussed with the primary team and pharmacy.  Vonzella Nipple M.D. 10/31/2020, 6:40 PM

## 2020-10-31 NOTE — Progress Notes (Signed)
Pulmonary Critical Care Medicine [facility] [place]   PULMONARY CRITICAL CARE SERVICE  PROGRESS NOTE  Date of Service: 10/31/2020  Zayvon Alicea  [id number]:[id number]  DOB: 09/09/1956   DOA: 10/19/2020  Referring Physician: Carron Curie, MD  HPI: Denis Carreon is a 64 y.o. male seen for follow up of Acute on Chronic Respiratory Failure.  Patient is on 50% oxygen on T collar good saturations are good  Medications: Reviewed on Rounds  Physical Exam:  Vitals: Temperature 97.1 pulse 93 respiratory rate 37 blood pressure is 139/68 saturations 99%  Ventilator Settings on T collar with an FiO2 of 50%  . General: Comfortable at this time . Eyes: Grossly normal lids, irises & conjunctiva . ENT: grossly tongue is normal . Neck: no obvious mass . Cardiovascular: S1 S2 normal no gallop . Respiratory: No rhonchi no rales are noted at this time . Abdomen: soft . Skin: no rash seen on limited exam . Musculoskeletal: not rigid . Psychiatric:unable to assess . Neurologic: no seizure no involuntary movements         Lab Data:   Basic Metabolic Panel: Recent Labs  Lab 10/29/20 0418 10/30/20 0343 10/31/20 0325  NA 138 138  --   K 3.6 3.2* 3.3*  CL 107 104  --   CO2 21* 24  --   GLUCOSE 143* 162*  --   BUN 22 21  --   CREATININE 0.84 0.86  --   CALCIUM 8.5* 8.4*  --     ABG: Recent Labs  Lab 10/28/20 1750 10/30/20 1207  PHART 7.452* 7.502*  PCO2ART 30.7* 31.2*  PO2ART 49.0* 67.0*  HCO3 21.0 24.1  O2SAT 82.5 93.1    Liver Function Tests: No results for input(s): AST, ALT, ALKPHOS, BILITOT, PROT, ALBUMIN in the last 168 hours. No results for input(s): LIPASE, AMYLASE in the last 168 hours. No results for input(s): AMMONIA in the last 168 hours.  CBC: Recent Labs  Lab 10/30/20 0343  WBC 9.9  HGB 8.8*  HCT 26.5*  MCV 90.4  PLT 310    Cardiac Enzymes: No results for input(s): CKTOTAL, CKMB, CKMBINDEX, TROPONINI in the last 168 hours.  BNP (last 3 results) No  results for input(s): BNP in the last 8760 hours.  ProBNP (last 3 results) No results for input(s): PROBNP in the last 8760 hours.  Radiological Exams: DG Chest 1 View  Result Date: 10/30/2020 CLINICAL DATA:  Respiratory failure EXAM: CHEST  1 VIEW COMPARISON:  10/29/2020 FINDINGS: Tracheostomy and right internal jugular central venous catheter with its tip within the superior vena cava are unchanged. Pulmonary insufflation is stable, though lung volumes are slightly small. Superimposed extensive bilateral perihilar and lower lung zone pulmonary infiltrate persists, slightly progressive at the right lung base, likely infectious or inflammatory in etiology. No pneumothorax or pleural effusion. Cardiac size within normal limits. Pulmonary vascularity normal. No acute bone abnormality. IMPRESSION: Stable support tubes and lines. Preserved pulmonary insufflation. Progressive bibasilar pulmonary infiltrate, likely infectious in the appropriate clinical setting. Electronically Signed   By: Helyn Numbers MD   On: 10/30/2020 06:26   DG Chest Port 1 View  Result Date: 10/29/2020 CLINICAL DATA:  Encounter for central line placement. EXAM: PORTABLE CHEST 1 VIEW COMPARISON:  October 29, 2020 FINDINGS: Interval placement of RIGHT-sided central venous access device, entering via IJ approach terminating at the upper portion of the RIGHT atrium. Tracheostomy tube in situ, tip between clavicular heads. Cardiomediastinal contours and hilar structures are stable accounting for degree of rotation  which is similar to the prior exam. Patchy bilateral parenchymal opacities and obscured LEFT hemidiaphragm as on the previous exam. No visible pneumothorax. On limited assessment no acute skeletal process. IMPRESSION: 1. Interval placement of RIGHT-sided central venous access device, entering via IJ approach terminating at the upper portion of the RIGHT atrium. 2. Persistent signs of suspected multifocal pneumonia perhaps  associated with small LEFT effusion. 3. No visible pneumothorax. Electronically Signed   By: Donzetta Kohut M.D.   On: 10/29/2020 18:27   DG Chest Port 1 View  Result Date: 10/29/2020 CLINICAL DATA:  Respiratory failure. EXAM: PORTABLE CHEST 1 VIEW COMPARISON:  October 25, 2020. FINDINGS: Stable cardiomediastinal silhouette. Tracheostomy tube is unchanged in position. No pneumothorax is noted. Increased bilateral lung opacities are noted concerning for worsening multifocal pneumonia. Small bilateral pleural effusions may be present. Bony thorax is unremarkable. IMPRESSION: Worsening bilateral lung opacities are noted concerning for worsening multifocal pneumonia. Electronically Signed   By: Lupita Raider M.D.   On: 10/29/2020 15:36    Assessment/Plan Active Problems:   Acute on chronic respiratory failure with hypoxia (HCC)   Anoxic encephalopathy (HCC)   Cardiac arrest (HCC)   Aspiration pneumonia (HCC)   1. Acute on chronic respiratory failure hypoxia we will continue with T collar titrate oxygen continue pulmonary toilet. 2. Anoxic encephalopathy no change we will continue with supportive care 3. Cardiac arrest rhythm stable 4. Aspiration pneumonia treated   I have personally seen and evaluated the patient, evaluated laboratory and imaging results, formulated the assessment and plan and placed orders. The Patient requires high complexity decision making with multiple systems involvement.  Rounds were done with the Respiratory Therapy Director and Staff therapists and discussed with nursing staff also.  Yevonne Pax, MD [facility] Pulmonary Critical Care Medicine Sleep Medicine

## 2020-11-01 DIAGNOSIS — J690 Pneumonitis due to inhalation of food and vomit: Secondary | ICD-10-CM | POA: Diagnosis not present

## 2020-11-01 DIAGNOSIS — G931 Anoxic brain damage, not elsewhere classified: Secondary | ICD-10-CM | POA: Diagnosis not present

## 2020-11-01 DIAGNOSIS — J9621 Acute and chronic respiratory failure with hypoxia: Secondary | ICD-10-CM | POA: Diagnosis not present

## 2020-11-01 DIAGNOSIS — I469 Cardiac arrest, cause unspecified: Secondary | ICD-10-CM | POA: Diagnosis not present

## 2020-11-01 NOTE — Progress Notes (Signed)
Pulmonary Critical Care Medicine [facility] [place]   PULMONARY CRITICAL CARE SERVICE  PROGRESS NOTE  Date of Service: 11/01/2020  Dakotah Orrego  XAJ:[id number]  DOB: 05-06-1956   DOA: 10/19/2020  Referring Physician: Carron Curie, MD  HPI: Kostantinos Tallman is a 64 y.o. male seen for follow up of Acute on Chronic Respiratory Failure.  Patient currently is on pressure support has been on 12/5 good saturations are noted  Medications: Reviewed on Rounds  Physical Exam:  Vitals: Temperature is 99.2 pulse 84 respiratory 26 blood pressure is 91/53 saturations 95%  Ventilator Settings on pressure support FiO2 35% pressure of 12/5  . General: Comfortable at this time . Eyes: Grossly normal lids, irises & conjunctiva . ENT: grossly tongue is normal . Neck: no obvious mass . Cardiovascular: S1 S2 normal no gallop . Respiratory: Coarse breath sounds with a few scattered rhonchi . Abdomen: soft . Skin: no rash seen on limited exam . Musculoskeletal: not rigid . Psychiatric:unable to assess . Neurologic: no seizure no involuntary movements         Lab Data:   Basic Metabolic Panel: Recent Labs  Lab 10/29/20 0418 10/30/20 0343 10/31/20 0325  NA 138 138  --   K 3.6 3.2* 3.3*  CL 107 104  --   CO2 21* 24  --   GLUCOSE 143* 162*  --   BUN 22 21  --   CREATININE 0.84 0.86  --   CALCIUM 8.5* 8.4*  --     ABG: Recent Labs  Lab 10/28/20 1750 10/30/20 1207  PHART 7.452* 7.502*  PCO2ART 30.7* 31.2*  PO2ART 49.0* 67.0*  HCO3 21.0 24.1  O2SAT 82.5 93.1    Liver Function Tests: No results for input(s): AST, ALT, ALKPHOS, BILITOT, PROT, ALBUMIN in the last 168 hours. No results for input(s): LIPASE, AMYLASE in the last 168 hours. No results for input(s): AMMONIA in the last 168 hours.  CBC: Recent Labs  Lab 10/30/20 0343  WBC 9.9  HGB 8.8*  HCT 26.5*  MCV 90.4  PLT 310    Cardiac Enzymes: No results for input(s): CKTOTAL, CKMB, CKMBINDEX, TROPONINI in the last  168 hours.  BNP (last 3 results) No results for input(s): BNP in the last 8760 hours.  ProBNP (last 3 results) No results for input(s): PROBNP in the last 8760 hours.  Radiological Exams: No results found.  Assessment/Plan Active Problems:   Acute on chronic respiratory failure with hypoxia (HCC)   Anoxic encephalopathy (HCC)   Cardiac arrest (HCC)   Aspiration pneumonia (HCC)   1. Acute on chronic respiratory failure hypoxia we will continue with pressure support titrate oxygen continue pulmonary toilet. 2. Anoxic encephalopathy no change 3. Cardiac arrest with stable 4. Aspiration pneumonia treated   I have personally seen and evaluated the patient, evaluated laboratory and imaging results, formulated the assessment and plan and placed orders. The Patient requires high complexity decision making with multiple systems involvement.  Rounds were done with the Respiratory Therapy Director and Staff therapists and discussed with nursing staff also.  Yevonne Pax, MD [facility] Pulmonary Critical Care Medicine Sleep Medicine

## 2020-11-02 DIAGNOSIS — G931 Anoxic brain damage, not elsewhere classified: Secondary | ICD-10-CM

## 2020-11-02 DIAGNOSIS — I469 Cardiac arrest, cause unspecified: Secondary | ICD-10-CM | POA: Diagnosis not present

## 2020-11-02 DIAGNOSIS — J690 Pneumonitis due to inhalation of food and vomit: Secondary | ICD-10-CM | POA: Diagnosis not present

## 2020-11-02 DIAGNOSIS — J9621 Acute and chronic respiratory failure with hypoxia: Secondary | ICD-10-CM

## 2020-11-02 NOTE — Progress Notes (Signed)
Pulmonary Critical Care Medicine [facility] [place]   PULMONARY CRITICAL CARE SERVICE  PROGRESS NOTE  Date of Service: 11/02/2020  Richard Campbell  KZS:[id number]  DOB: 09-Oct-1956   DOA: 10/19/2020  Referring Physician: Carron Curie, MD  HPI: Richard Campbell is a 65 y.o. male seen for follow up of Acute on Chronic Respiratory Failure.  Patient currently is on the ventilator and full support has been having periodic increased respiratory rate and is likely related to underlying neurological issue  Medications: Reviewed on Rounds  Physical Exam:  Vitals: Temperature is 97.7 pulse 86 respiratory rate is 26 blood pressure 126/80 saturations 96%  Ventilator Settings on assist control FiO2 35% PEEP 5 tidal volume 530  . General: Comfortable at this time . Eyes: Grossly normal lids, irises & conjunctiva . ENT: grossly tongue is normal . Neck: no obvious mass . Cardiovascular: S1 S2 normal no gallop . Respiratory: No rhonchi no rales noted at this time . Abdomen: soft . Skin: no rash seen on limited exam . Musculoskeletal: not rigid . Psychiatric:unable to assess . Neurologic: no seizure no involuntary movements         Lab Data:   Basic Metabolic Panel: Recent Labs  Lab 10/29/20 0418 10/30/20 0343 10/31/20 0325  NA 138 138  --   K 3.6 3.2* 3.3*  CL 107 104  --   CO2 21* 24  --   GLUCOSE 143* 162*  --   BUN 22 21  --   CREATININE 0.84 0.86  --   CALCIUM 8.5* 8.4*  --     ABG: Recent Labs  Lab 10/28/20 1750 10/30/20 1207  PHART 7.452* 7.502*  PCO2ART 30.7* 31.2*  PO2ART 49.0* 67.0*  HCO3 21.0 24.1  O2SAT 82.5 93.1    Liver Function Tests: No results for input(s): AST, ALT, ALKPHOS, BILITOT, PROT, ALBUMIN in the last 168 hours. No results for input(s): LIPASE, AMYLASE in the last 168 hours. No results for input(s): AMMONIA in the last 168 hours.  CBC: Recent Labs  Lab 10/30/20 0343  WBC 9.9  HGB 8.8*  HCT 26.5*  MCV 90.4  PLT 310    Cardiac  Enzymes: No results for input(s): CKTOTAL, CKMB, CKMBINDEX, TROPONINI in the last 168 hours.  BNP (last 3 results) No results for input(s): BNP in the last 8760 hours.  ProBNP (last 3 results) No results for input(s): PROBNP in the last 8760 hours.  Radiological Exams: No results found.  Assessment/Plan Active Problems:   Acute on chronic respiratory failure with hypoxia (HCC)   Anoxic encephalopathy (HCC)   Cardiac arrest (HCC)   Aspiration pneumonia (HCC)   1. Acute chronic respiratory failure hypoxia we will continue with full support on the ventilator respiratory therapy will check the RSB and try to start weaning again 2. Anoxic encephalopathy no change 3. Cardiac arrest rhythm stable 4. Aspiration pneumonia treated   I have personally seen and evaluated the patient, evaluated laboratory and imaging results, formulated the assessment and plan and placed orders. The Patient requires high complexity decision making with multiple systems involvement.  Rounds were done with the Respiratory Therapy Director and Staff therapists and discussed with nursing staff also.  Yevonne Pax, MD [facility] Pulmonary Critical Care Medicine Sleep Medicine

## 2020-11-03 DIAGNOSIS — J690 Pneumonitis due to inhalation of food and vomit: Secondary | ICD-10-CM | POA: Diagnosis not present

## 2020-11-03 DIAGNOSIS — J9621 Acute and chronic respiratory failure with hypoxia: Secondary | ICD-10-CM | POA: Diagnosis not present

## 2020-11-03 DIAGNOSIS — G931 Anoxic brain damage, not elsewhere classified: Secondary | ICD-10-CM | POA: Diagnosis not present

## 2020-11-03 DIAGNOSIS — I469 Cardiac arrest, cause unspecified: Secondary | ICD-10-CM | POA: Diagnosis not present

## 2020-11-03 NOTE — Progress Notes (Signed)
Pulmonary Critical Care Medicine [facility] [place]   PULMONARY CRITICAL CARE SERVICE  PROGRESS NOTE  Date of Service: 11/03/2020  Tevin Shillingford  KGY:[id number]  DOB: 25-Oct-1956   DOA: 10/19/2020  Referring Physician: Carron Curie, MD  HPI: Nicanor Mendolia is a 65 y.o. male seen for follow up of Acute on Chronic Respiratory Failure.  Patient's oxygen requirements have gone up is on full support 100% FiO2  Medications: Reviewed on Rounds  Physical Exam:  Vitals: Temperature is 98.6 pulse 91 respiratory rate is 28 blood pressure 105/62 saturations 100%  Ventilator Settings assist control FiO2 100% tidal volume is 560 PEEP 5  . General: Comfortable at this time . Eyes: Grossly normal lids, irises & conjunctiva . ENT: grossly tongue is normal . Neck: no obvious mass . Cardiovascular: S1 S2 normal no gallop . Respiratory: Scattered rhonchi no rales . Abdomen: soft . Skin: no rash seen on limited exam . Musculoskeletal: not rigid . Psychiatric:unable to assess . Neurologic: no seizure no involuntary movements         Lab Data:   Basic Metabolic Panel: Recent Labs  Lab 10/29/20 0418 10/30/20 0343 10/31/20 0325  NA 138 138  --   K 3.6 3.2* 3.3*  CL 107 104  --   CO2 21* 24  --   GLUCOSE 143* 162*  --   BUN 22 21  --   CREATININE 0.84 0.86  --   CALCIUM 8.5* 8.4*  --     ABG: Recent Labs  Lab 10/28/20 1750 10/30/20 1207  PHART 7.452* 7.502*  PCO2ART 30.7* 31.2*  PO2ART 49.0* 67.0*  HCO3 21.0 24.1  O2SAT 82.5 93.1    Liver Function Tests: No results for input(s): AST, ALT, ALKPHOS, BILITOT, PROT, ALBUMIN in the last 168 hours. No results for input(s): LIPASE, AMYLASE in the last 168 hours. No results for input(s): AMMONIA in the last 168 hours.  CBC: Recent Labs  Lab 10/30/20 0343  WBC 9.9  HGB 8.8*  HCT 26.5*  MCV 90.4  PLT 310    Cardiac Enzymes: No results for input(s): CKTOTAL, CKMB, CKMBINDEX, TROPONINI in the last 168 hours.  BNP (last  3 results) No results for input(s): BNP in the last 8760 hours.  ProBNP (last 3 results) No results for input(s): PROBNP in the last 8760 hours.  Radiological Exams: No results found.  Assessment/Plan Active Problems:   Acute on chronic respiratory failure with hypoxia (HCC)   Anoxic encephalopathy (HCC)   Cardiac arrest (HCC)   Aspiration pneumonia (HCC)   1. Acute on chronic respiratory failure hypoxia we will continue with full support on the ventilator saturations are improved we will try to wean FiO2 down patient is also significantly tachypneic so probably no weaning today 2. Pneumonia due to aspiration last chest x-ray actually worsening would recommend getting a follow-up chest film on him 3. Anoxic encephalopathy no change 4. Cardiac arrest rhythm stable   I have personally seen and evaluated the patient, evaluated laboratory and imaging results, formulated the assessment and plan and placed orders. The Patient requires high complexity decision making with multiple systems involvement.  Rounds were done with the Respiratory Therapy Director and Staff therapists and discussed with nursing staff also.  Yevonne Pax, MD [facility] Pulmonary Critical Care Medicine Sleep Medicine

## 2020-11-04 ENCOUNTER — Other Ambulatory Visit (HOSPITAL_COMMUNITY): Payer: Medicare (Managed Care)

## 2020-11-04 DIAGNOSIS — G931 Anoxic brain damage, not elsewhere classified: Secondary | ICD-10-CM | POA: Diagnosis not present

## 2020-11-04 DIAGNOSIS — J690 Pneumonitis due to inhalation of food and vomit: Secondary | ICD-10-CM | POA: Diagnosis not present

## 2020-11-04 DIAGNOSIS — J9621 Acute and chronic respiratory failure with hypoxia: Secondary | ICD-10-CM | POA: Diagnosis not present

## 2020-11-04 DIAGNOSIS — I469 Cardiac arrest, cause unspecified: Secondary | ICD-10-CM | POA: Diagnosis not present

## 2020-11-04 NOTE — Progress Notes (Signed)
Pulmonary Critical Care Medicine [facility] [place]   PULMONARY CRITICAL CARE SERVICE  PROGRESS NOTE  Date of Service: 11/04/2020  Richard Campbell  [id number]  DOB: [birth year]   DOA: 10/19/2020  Referring Physician: Carron Curie, MD  HPI: Richard Campbell is a 65 y.o. male seen for follow up of Acute on Chronic Respiratory Failure.  Patient currently is on full support on assist control mode has been requiring 100% FiO2 overall not doing very well today  Medications: Reviewed on Rounds  Physical Exam:  Vitals: Temperature is 99.6 pulse 96 respiratory rate 36 blood pressure is 116/70 saturations 100%  Ventilator Settings on assist control FiO2 100% tidal volume 500  . General: Comfortable at this time . Eyes: Grossly normal lids, irises & conjunctiva . ENT: grossly tongue is normal . Neck: no obvious mass . Cardiovascular: S1 S2 normal no gallop . Respiratory: No rhonchi no rales noted at this time . Abdomen: soft . Skin: no rash seen on limited exam . Musculoskeletal: not rigid . Psychiatric:unable to assess . Neurologic: no seizure no involuntary movements         Lab Data:   Basic Metabolic Panel: Recent Labs  Lab 10/29/20 0418 10/30/20 0343 10/31/20 0325  NA 138 138  --   K 3.6 3.2* 3.3*  CL 107 104  --   CO2 21* 24  --   GLUCOSE 143* 162*  --   BUN 22 21  --   CREATININE 0.84 0.86  --   CALCIUM 8.5* 8.4*  --     ABG: Recent Labs  Lab 10/28/20 1750 10/30/20 1207  PHART 7.452* 7.502*  PCO2ART 30.7* 31.2*  PO2ART 49.0* 67.0*  HCO3 21.0 24.1  O2SAT 82.5 93.1    Liver Function Tests: No results for input(s): AST, ALT, ALKPHOS, BILITOT, PROT, ALBUMIN in the last 168 hours. No results for input(s): LIPASE, AMYLASE in the last 168 hours. No results for input(s): AMMONIA in the last 168 hours.  CBC: Recent Labs  Lab 10/30/20 0343  WBC 9.9  HGB 8.8*  HCT 26.5*  MCV 90.4  PLT 310    Cardiac Enzymes: No results for input(s): CKTOTAL, CKMB,  CKMBINDEX, TROPONINI in the last 168 hours.  BNP (last 3 results) No results for input(s): BNP in the last 8760 hours.  ProBNP (last 3 results) No results for input(s): PROBNP in the last 8760 hours.  Radiological Exams: No results found.  Assessment/Plan Active Problems:   Acute on chronic respiratory failure with hypoxia (HCC)   Anoxic encephalopathy (HCC)   Cardiac arrest (HCC)   Aspiration pneumonia (HCC)   1. Acute on chronic respiratory failure with hypoxia we will continue with assist control titrate oxygen continue pulmonary toilet. 2. Anoxic encephalopathy no change we will continue to follow 3. Cardiac arrest rhythm stable 4. Aspiration pneumonia has been treated   I have personally seen and evaluated the patient, evaluated laboratory and imaging results, formulated the assessment and plan and placed orders. The Patient requires high complexity decision making with multiple systems involvement.  Rounds were done with the Respiratory Therapy Director and Staff therapists and discussed with nursing staff also.  Yevonne Pax, MD [facility] Pulmonary Critical Care Medicine Sleep Medicine

## 2020-11-05 DIAGNOSIS — J9621 Acute and chronic respiratory failure with hypoxia: Secondary | ICD-10-CM | POA: Diagnosis not present

## 2020-11-05 DIAGNOSIS — G931 Anoxic brain damage, not elsewhere classified: Secondary | ICD-10-CM | POA: Diagnosis not present

## 2020-11-05 DIAGNOSIS — J690 Pneumonitis due to inhalation of food and vomit: Secondary | ICD-10-CM | POA: Diagnosis not present

## 2020-11-05 DIAGNOSIS — I469 Cardiac arrest, cause unspecified: Secondary | ICD-10-CM | POA: Diagnosis not present

## 2020-11-05 LAB — BASIC METABOLIC PANEL
Anion gap: 11 (ref 5–15)
BUN: 28 mg/dL — ABNORMAL HIGH (ref 8–23)
CO2: 24 mmol/L (ref 22–32)
Calcium: 8.2 mg/dL — ABNORMAL LOW (ref 8.9–10.3)
Chloride: 103 mmol/L (ref 98–111)
Creatinine, Ser: 0.84 mg/dL (ref 0.61–1.24)
GFR, Estimated: >60 mL/min (ref >60)
Glucose, Bld: 140 mg/dL — ABNORMAL HIGH (ref 70–99)
Potassium: 3.2 mmol/L — ABNORMAL LOW (ref 3.5–5.1)
Sodium: 138 mmol/L (ref 135–145)

## 2020-11-05 LAB — CBC
HCT: 26.7 % — ABNORMAL LOW (ref 39.0–52.0)
Hemoglobin: 8.4 g/dL — ABNORMAL LOW (ref 13.0–17.0)
MCH: 29.2 pg (ref 26.0–34.0)
MCHC: 31.5 g/dL (ref 30.0–36.0)
MCV: 92.7 fL (ref 80.0–100.0)
Platelets: 380 10*3/uL (ref 150–400)
RBC: 2.88 MIL/uL — ABNORMAL LOW (ref 4.22–5.81)
RDW: 15.5 % (ref 11.5–15.5)
WBC: 12 10*3/uL — ABNORMAL HIGH (ref 4.0–10.5)
nRBC: 0 % (ref 0.0–0.2)

## 2020-11-05 LAB — BRAIN NATRIURETIC PEPTIDE: B Natriuretic Peptide: 1432.6 pg/mL — ABNORMAL HIGH (ref 0.0–100.0)

## 2020-11-05 NOTE — Progress Notes (Signed)
Pulmonary Critical Care Medicine [facility] [place]   PULMONARY CRITICAL CARE SERVICE  PROGRESS NOTE  Date of Service: 11/05/2020  Richard Campbell  [id number]  DOB: [birth year]   DOA: 10/19/2020  Referring Physician: Carron Curie, MD  HPI: Richard Campbell is a 65 y.o. male seen for follow up of Acute on Chronic Respiratory Failure.  Patient currently is on the ventilator and full support was started on Versed because of increased respiratory rate  Medications: Reviewed on Rounds  Physical Exam:  Vitals: Temperature is 97.8 pulse 98 respiratory rate 39 blood pressure is 113/64 saturations 100%  Ventilator Settings on assist control FiO2 50% PEEP 5 tidal volume 5  . General: Comfortable at this time . Eyes: Grossly normal lids, irises & conjunctiva . ENT: grossly tongue is normal . Neck: no obvious mass . Cardiovascular: S1 S2 normal no gallop . Respiratory: No rhonchi very coarse breath . Abdomen: soft . Skin: no rash seen on limited exam . Musculoskeletal: not rigid . Psychiatric:unable to assess . Neurologic: no seizure no involuntary movements         Lab Data:   Basic Metabolic Panel: Recent Labs  Lab 10/30/20 0343 10/31/20 0325  NA 138  --   K 3.2* 3.3*  CL 104  --   CO2 24  --   GLUCOSE 162*  --   BUN 21  --   CREATININE 0.86  --   CALCIUM 8.4*  --     ABG: Recent Labs  Lab 10/30/20 1207  PHART 7.502*  PCO2ART 31.2*  PO2ART 67.0*  HCO3 24.1  O2SAT 93.1    Liver Function Tests: No results for input(s): AST, ALT, ALKPHOS, BILITOT, PROT, ALBUMIN in the last 168 hours. No results for input(s): LIPASE, AMYLASE in the last 168 hours. No results for input(s): AMMONIA in the last 168 hours.  CBC: Recent Labs  Lab 10/30/20 0343  WBC 9.9  HGB 8.8*  HCT 26.5*  MCV 90.4  PLT 310    Cardiac Enzymes: No results for input(s): CKTOTAL, CKMB, CKMBINDEX, TROPONINI in the last 168 hours.  BNP (last 3 results) No results for input(s): BNP in the  last 8760 hours.  ProBNP (last 3 results) No results for input(s): PROBNP in the last 8760 hours.  Radiological Exams: DG Chest 1 View  Result Date: 11/04/2020 CLINICAL DATA:  Respiratory failure. EXAM: CHEST  1 VIEW COMPARISON:  October 30, 2020 FINDINGS: The right-sided central venous catheter appears kinked and has likely retracted somewhat into the right subclavian vein. The tip now projects over the right brachiocephalic vein. The tracheostomy tube terminates above the carina. There are diffuse bilateral airspace opacities which have substantially worsened since the prior study. There are bilateral pleural effusions. The heart size is stable but enlarged. There is no pneumothorax. IMPRESSION: 1. Worsening bilateral airspace opacities. 2. The right-sided central venous catheter now appears to be kinked. 3. Growing bilateral pleural effusions. 4. Cardiomegaly. These results will be called to the ordering clinician or representative by the Radiologist Assistant, and communication documented in the PACS or [employer]. Electronically Signed   By: Katherine Mantle M.D.   On: 11/04/2020 20:54    Assessment/Plan Active Problems:   Acute on chronic respiratory failure with hypoxia (HCC)   Anoxic encephalopathy (HCC)   Cardiac arrest (HCC)   Aspiration pneumonia (HCC)   1. Acute on chronic respiratory failure hypoxia we will continue with full support on the ventilator while the patient is on Versed drip 2. Anoxic encephalopathy  no change 3. Cardiac arrest on telemetry prognosis guarded 4. Aspiration pneumonia appears to have worsening bilateral airspace disease noted along with the cardiomegaly could also be a component of fluid overload   I have personally seen and evaluated the patient, evaluated laboratory and imaging results, formulated the assessment and plan and placed orders. The Patient requires high complexity decision making with multiple systems involvement.  Rounds were  done with the Respiratory Therapy Director and Staff therapists and discussed with nursing staff also.  Yevonne Pax, MD [facility] Pulmonary Critical Care Medicine Sleep Medicine

## 2020-11-06 ENCOUNTER — Other Ambulatory Visit (HOSPITAL_COMMUNITY): Payer: Medicare (Managed Care)

## 2020-11-06 DIAGNOSIS — I469 Cardiac arrest, cause unspecified: Secondary | ICD-10-CM | POA: Diagnosis not present

## 2020-11-06 DIAGNOSIS — G931 Anoxic brain damage, not elsewhere classified: Secondary | ICD-10-CM | POA: Diagnosis not present

## 2020-11-06 DIAGNOSIS — J690 Pneumonitis due to inhalation of food and vomit: Secondary | ICD-10-CM | POA: Diagnosis not present

## 2020-11-06 DIAGNOSIS — J9621 Acute and chronic respiratory failure with hypoxia: Secondary | ICD-10-CM | POA: Diagnosis not present

## 2020-11-06 NOTE — Progress Notes (Signed)
Pulmonary Critical Care Medicine [facility] [place]   PULMONARY CRITICAL CARE SERVICE  PROGRESS NOTE  Date of Service: 11/06/2020  Richard Campbell  [id number]  DOB: 1956/02/23   DOA: 10/19/2020  Referring Physician: Carron Curie, MD  HPI: Richard Campbell is a 65 y.o. male seen for follow up of Acute on Chronic Respiratory Failure.  Patient currently is off the ventilator has been on assist control mode comfortable without distress  Medications: Reviewed on Rounds  Physical Exam:  Vitals: Temperature is 98.7 pulse 87 respiratory rate is 28 blood pressure is 107/65  Ventilator Settings on assist control FiO2 is 35% PEEP of 5 tidal volume is 500  . General: Comfortable at this time . Eyes: Grossly normal lids, irises & conjunctiva . ENT: grossly tongue is normal . Neck: no obvious mass . Cardiovascular: S1 S2 normal no gallop . Respiratory: No rhonchi very coarse breath sounds . Abdomen: soft . Skin: no rash seen on limited exam . Musculoskeletal: not rigid . Psychiatric:unable to assess . Neurologic: no seizure no involuntary movements         Lab Data:   Basic Metabolic Panel: Recent Labs  Lab 10/31/20 0325 11/05/20 1527  NA  --  138  K 3.3* 3.2*  CL  --  103  CO2  --  24  GLUCOSE  --  140*  BUN  --  28*  CREATININE  --  0.84  CALCIUM  --  8.2*    ABG: No results for input(s): PHART, PCO2ART, PO2ART, HCO3, O2SAT in the last 168 hours.  Liver Function Tests: No results for input(s): AST, ALT, ALKPHOS, BILITOT, PROT, ALBUMIN in the last 168 hours. No results for input(s): LIPASE, AMYLASE in the last 168 hours. No results for input(s): AMMONIA in the last 168 hours.  CBC: Recent Labs  Lab 11/05/20 1527  WBC 12.0*  HGB 8.4*  HCT 26.7*  MCV 92.7  PLT 380    Cardiac Enzymes: No results for input(s): CKTOTAL, CKMB, CKMBINDEX, TROPONINI in the last 168 hours.  BNP (last 3 results) Recent Labs    11/05/20 1527  BNP 1,432.6*    ProBNP (last 3  results) No results for input(s): PROBNP in the last 8760 hours.  Radiological Exams: DG Chest 1 View  Result Date: 11/04/2020 CLINICAL DATA:  Respiratory failure. EXAM: CHEST  1 VIEW COMPARISON:  October 30, 2020 FINDINGS: The right-sided central venous catheter appears kinked and has likely retracted somewhat into the right subclavian vein. The tip now projects over the right brachiocephalic vein. The tracheostomy tube terminates above the carina. There are diffuse bilateral airspace opacities which have substantially worsened since the prior study. There are bilateral pleural effusions. The heart size is stable but enlarged. There is no pneumothorax. IMPRESSION: 1. Worsening bilateral airspace opacities. 2. The right-sided central venous catheter now appears to be kinked. 3. Growing bilateral pleural effusions. 4. Cardiomegaly. These results will be called to the ordering clinician or representative by the Radiologist Assistant, and communication documented in the PACS or [employer]. Electronically Signed   By: Katherine Mantle M.D.   On: 11/04/2020 20:54   DG CHEST PORT 1 VIEW  Result Date: 11/06/2020 CLINICAL DATA:  Central line placement EXAM: PORTABLE CHEST 1 VIEW COMPARISON:  Portable exam 1421 hours compared to 0641 hours FINDINGS: Tracheostomy tube stable projecting over tracheal air column. Repositioned RIGHT jugular central venous catheter with tip now projecting over SVC. Normal heart size, mediastinal contours, and pulmonary vascularity. Atherosclerotic calcification aorta. Scattered BILATERAL  pulmonary infiltrates greater at bases unchanged. No pleural effusion or pneumothorax. Osseous structures unremarkable. IMPRESSION: Tip of RIGHT jugular line now projects over SVC. Persistent diffuse BILATERAL pulmonary infiltrates. Aortic Atherosclerosis (ICD10-I70.0). Electronically Signed   By: Ulyses Southward M.D.   On: 11/06/2020 14:33   DG CHEST PORT 1 VIEW  Result Date: 11/06/2020 CLINICAL  DATA:  Pneumonia. EXAM: PORTABLE CHEST 1 VIEW COMPARISON:  11/04/2020. FINDINGS: Right central line is again noted to be kinked with tip over the right brachiocephalic vein. Tracheostomy tube in stable position. Tubing and leads noted over the chest. Heart size stable. Lung volumes. Diffuse bilateral pulmonary infiltrates, left side greater than right again noted. Interim improvement from prior exam. Small left pleural effusion cannot be excluded. No pneumothorax. IMPRESSION: 1. Right central line again noted to be kinked with tip over the right brachiocephalic vein. Tracheostomy tube in stable position. 2. Low lung volumes. Diffuse bilateral pulmonary infiltrates, left side greater than right again noted. Interim improvement from prior exam. Small left pleural effusion cannot be excluded. Electronically Signed   By: Maisie Fus  Register   On: 11/06/2020 07:00    Assessment/Plan Active Problems:   Acute on chronic respiratory failure with hypoxia (HCC)   Anoxic encephalopathy (HCC)   Cardiac arrest (HCC)   Aspiration pneumonia (HCC)   1. Acute on chronic respiratory failure with hypoxia we will continue with full support on the ventilator.  Patient not a candidate for weaning at this time. 2. Anoxic encephalopathy grossly unchanged 3. Cardiac arrest rhythm stable 4. Aspiration pneumonia treated   I have personally seen and evaluated the patient, evaluated laboratory and imaging results, formulated the assessment and plan and placed orders. The Patient requires high complexity decision making with multiple systems involvement.  Rounds were done with the Respiratory Therapy Director and Staff therapists and discussed with nursing staff also.  Yevonne Pax, MD [facility] Pulmonary Critical Care Medicine Sleep Medicine

## 2020-11-06 NOTE — CV Procedure (Signed)
Echocardiogram not completed, patient undergoing a sterile procedure. Echo will be re-attempted at another time.   Leta Jungling RDCS

## 2020-11-07 ENCOUNTER — Other Ambulatory Visit (HOSPITAL_COMMUNITY): Payer: Medicare (Managed Care)

## 2020-11-07 DIAGNOSIS — J690 Pneumonitis due to inhalation of food and vomit: Secondary | ICD-10-CM | POA: Diagnosis not present

## 2020-11-07 DIAGNOSIS — I469 Cardiac arrest, cause unspecified: Secondary | ICD-10-CM | POA: Diagnosis not present

## 2020-11-07 DIAGNOSIS — G931 Anoxic brain damage, not elsewhere classified: Secondary | ICD-10-CM | POA: Diagnosis not present

## 2020-11-07 DIAGNOSIS — J9621 Acute and chronic respiratory failure with hypoxia: Secondary | ICD-10-CM | POA: Diagnosis not present

## 2020-11-07 LAB — ECHOCARDIOGRAM COMPLETE: Area-P 1/2: 2.69 cm2

## 2020-11-07 NOTE — Progress Notes (Signed)
  Echocardiogram 2D Echocardiogram has been performed.  Richard Campbell 11/07/2020, 3:29 PM

## 2020-11-07 NOTE — Progress Notes (Signed)
Pulmonary Critical Care Medicine [facility] [place]   PULMONARY CRITICAL CARE SERVICE  PROGRESS NOTE  Date of Service: 11/07/2020  Richard Campbell  [id number]  DOB: 10/06/1956   DOA: 10/19/2020  Referring Physician: Carron Curie, MD  HPI: Richard Campbell is a 65 y.o. male seen for follow up of Acute on Chronic Respiratory Failure.  Patient is currently on full support on assist control mode on 35% FiO2 not tolerating weaning it down  Medications: Reviewed on Rounds  Physical Exam:  Vitals: Temperature is 98.1 pulse 107 respiratory rate is 14 blood pressure is 107/80 saturations 99%  Ventilator Settings on assist control FiO2 is 35% on assist control Tidal volume 500 PEEP 5  . General: Comfortable at this time . Eyes: Grossly normal lids, irises & conjunctiva . ENT: grossly tongue is normal . Neck: no obvious mass . Cardiovascular: S1 S2 normal no gallop . Respiratory: Scattered rhonchi expansion is equal . Abdomen: soft . Skin: no rash seen on limited exam . Musculoskeletal: not rigid . Psychiatric:unable to assess . Neurologic: no seizure no involuntary movements         Lab Data:   Basic Metabolic Panel: Recent Labs  Lab 11/05/20 1527  NA 138  K 3.2*  CL 103  CO2 24  GLUCOSE 140*  BUN 28*  CREATININE 0.84  CALCIUM 8.2*    ABG: No results for input(s): PHART, PCO2ART, PO2ART, HCO3, O2SAT in the last 168 hours.  Liver Function Tests: No results for input(s): AST, ALT, ALKPHOS, BILITOT, PROT, ALBUMIN in the last 168 hours. No results for input(s): LIPASE, AMYLASE in the last 168 hours. No results for input(s): AMMONIA in the last 168 hours.  CBC: Recent Labs  Lab 11/05/20 1527  WBC 12.0*  HGB 8.4*  HCT 26.7*  MCV 92.7  PLT 380    Cardiac Enzymes: No results for input(s): CKTOTAL, CKMB, CKMBINDEX, TROPONINI in the last 168 hours.  BNP (last 3 results) Recent Labs    11/05/20 1527  BNP 1,432.6*    ProBNP (last 3 results) No results for  input(s): PROBNP in the last 8760 hours.  Radiological Exams: DG CHEST PORT 1 VIEW  Result Date: 11/06/2020 CLINICAL DATA:  Central line placement EXAM: PORTABLE CHEST 1 VIEW COMPARISON:  Portable exam 1421 hours compared to 0641 hours FINDINGS: Tracheostomy tube stable projecting over tracheal air column. Repositioned RIGHT jugular central venous catheter with tip now projecting over SVC. Normal heart size, mediastinal contours, and pulmonary vascularity. Atherosclerotic calcification aorta. Scattered BILATERAL pulmonary infiltrates greater at bases unchanged. No pleural effusion or pneumothorax. Osseous structures unremarkable. IMPRESSION: Tip of RIGHT jugular line now projects over SVC. Persistent diffuse BILATERAL pulmonary infiltrates. Aortic Atherosclerosis (ICD10-I70.0). Electronically Signed   By: Ulyses Southward M.D.   On: 11/06/2020 14:33   DG CHEST PORT 1 VIEW  Result Date: 11/06/2020 CLINICAL DATA:  Pneumonia. EXAM: PORTABLE CHEST 1 VIEW COMPARISON:  11/04/2020. FINDINGS: Right central line is again noted to be kinked with tip over the right brachiocephalic vein. Tracheostomy tube in stable position. Tubing and leads noted over the chest. Heart size stable. Lung volumes. Diffuse bilateral pulmonary infiltrates, left side greater than right again noted. Interim improvement from prior exam. Small left pleural effusion cannot be excluded. No pneumothorax. IMPRESSION: 1. Right central line again noted to be kinked with tip over the right brachiocephalic vein. Tracheostomy tube in stable position. 2. Low lung volumes. Diffuse bilateral pulmonary infiltrates, left side greater than right again noted. Interim improvement from prior exam.  Small left pleural effusion cannot be excluded. Electronically Signed   By: Maisie Fus  Register   On: 11/06/2020 07:00    Assessment/Plan Active Problems:   Acute on chronic respiratory failure with hypoxia (HCC)   Anoxic encephalopathy (HCC)   Cardiac arrest (HCC)    Aspiration pneumonia (HCC)   1. Acute on chronic respiratory failure hypoxia we will continue with assist control titrate oxygen continue pulmonary toilet. 2. Acute encephalopathy no change 3. Cardiac arrest rhythm stable 4. Aspiration pneumonia has been treated   I have personally seen and evaluated the patient, evaluated laboratory and imaging results, formulated the assessment and plan and placed orders. The Patient requires high complexity decision making with multiple systems involvement.  Rounds were done with the Respiratory Therapy Director and Staff therapists and discussed with nursing staff also.  Yevonne Pax, MD [facility] Pulmonary Critical Care Medicine Sleep Medicine

## 2020-11-08 DIAGNOSIS — I469 Cardiac arrest, cause unspecified: Secondary | ICD-10-CM | POA: Diagnosis not present

## 2020-11-08 DIAGNOSIS — J690 Pneumonitis due to inhalation of food and vomit: Secondary | ICD-10-CM | POA: Diagnosis not present

## 2020-11-08 DIAGNOSIS — J9621 Acute and chronic respiratory failure with hypoxia: Secondary | ICD-10-CM | POA: Diagnosis not present

## 2020-11-08 DIAGNOSIS — G931 Anoxic brain damage, not elsewhere classified: Secondary | ICD-10-CM | POA: Diagnosis not present

## 2020-11-08 LAB — COMPREHENSIVE METABOLIC PANEL
ALT: 52 U/L — ABNORMAL HIGH (ref 0–44)
AST: 65 U/L — ABNORMAL HIGH (ref 15–41)
Albumin: 2.1 g/dL — ABNORMAL LOW (ref 3.5–5.0)
Alkaline Phosphatase: 107 U/L (ref 38–126)
Anion gap: 13 (ref 5–15)
BUN: 22 mg/dL (ref 8–23)
CO2: 26 mmol/L (ref 22–32)
Calcium: 8.7 mg/dL — ABNORMAL LOW (ref 8.9–10.3)
Chloride: 99 mmol/L (ref 98–111)
Creatinine, Ser: 0.83 mg/dL (ref 0.61–1.24)
GFR, Estimated: >60 mL/min (ref >60)
Glucose, Bld: 126 mg/dL — ABNORMAL HIGH (ref 70–99)
Potassium: 3.4 mmol/L — ABNORMAL LOW (ref 3.5–5.1)
Sodium: 138 mmol/L (ref 135–145)
Total Bilirubin: 0.6 mg/dL (ref 0.3–1.2)
Total Protein: 5.7 g/dL — ABNORMAL LOW (ref 6.5–8.1)

## 2020-11-08 LAB — CBC
HCT: 30.7 % — ABNORMAL LOW (ref 39.0–52.0)
Hemoglobin: 9.4 g/dL — ABNORMAL LOW (ref 13.0–17.0)
MCH: 28.3 pg (ref 26.0–34.0)
MCHC: 30.6 g/dL (ref 30.0–36.0)
MCV: 92.5 fL (ref 80.0–100.0)
Platelets: 348 10*3/uL (ref 150–400)
RBC: 3.32 MIL/uL — ABNORMAL LOW (ref 4.22–5.81)
RDW: 15.7 % — ABNORMAL HIGH (ref 11.5–15.5)
WBC: 7.4 10*3/uL (ref 4.0–10.5)
nRBC: 0 % (ref 0.0–0.2)

## 2020-11-08 NOTE — Progress Notes (Signed)
Pulmonary Critical Care Medicine [facility] [place]   PULMONARY CRITICAL CARE SERVICE  PROGRESS NOTE  Date of Service: 11/08/2020  Dayln Tugwell  TOI:[id number]  DOB: [birth year]   DOA: 10/19/2020  Referring Physician: Carron Curie, MD  HPI: Kauan Kloosterman is a 65 y.o. male seen for follow up of Acute on Chronic Respiratory Failure.  Patient is on full support on the ventilator.  He has had no improvement as far as being able to wean.  There has been no neurological improvement either  Medications: Reviewed on Rounds  Physical Exam:  Vitals: Temperature is 98.4 pulse 75 respiratory rate is 23 blood pressure is 108/64 saturations 98%  Ventilator Settings on assist control FiO2 40% tidal volume 500 PEEP 5  . General: Comfortable at this time . Eyes: Grossly normal lids, irises & conjunctiva . ENT: grossly tongue is normal . Neck: no obvious mass . Cardiovascular: S1 S2 normal no gallop . Respiratory: No rhonchi very coarse breath . Abdomen: soft . Skin: no rash seen on limited exam . Musculoskeletal: not rigid . Psychiatric:unable to assess . Neurologic: no seizure no involuntary movements         Lab Data:   Basic Metabolic Panel: Recent Labs  Lab 11/05/20 1527 11/08/20 0645  NA 138 138  K 3.2* 3.4*  CL 103 99  CO2 24 26  GLUCOSE 140* 126*  BUN 28* 22  CREATININE 0.84 0.83  CALCIUM 8.2* 8.7*    ABG: No results for input(s): PHART, PCO2ART, PO2ART, HCO3, O2SAT in the last 168 hours.  Liver Function Tests: Recent Labs  Lab 11/08/20 0645  AST 65*  ALT 52*  ALKPHOS 107  BILITOT 0.6  PROT 5.7*  ALBUMIN 2.1*   No results for input(s): LIPASE, AMYLASE in the last 168 hours. No results for input(s): AMMONIA in the last 168 hours.  CBC: Recent Labs  Lab 11/05/20 1527 11/08/20 0645  WBC 12.0* 7.4  HGB 8.4* 9.4*  HCT 26.7* 30.7*  MCV 92.7 92.5  PLT 380 348    Cardiac Enzymes: No results for input(s): CKTOTAL, CKMB, CKMBINDEX, TROPONINI in the  last 168 hours.  BNP (last 3 results) Recent Labs    11/05/20 1527  BNP 1,432.6*    ProBNP (last 3 results) No results for input(s): PROBNP in the last 8760 hours.  Radiological Exams: DG CHEST PORT 1 VIEW  Result Date: 11/06/2020 CLINICAL DATA:  Central line placement EXAM: PORTABLE CHEST 1 VIEW COMPARISON:  Portable exam 1421 hours compared to 0641 hours FINDINGS: Tracheostomy tube stable projecting over tracheal air column. Repositioned RIGHT jugular central venous catheter with tip now projecting over SVC. Normal heart size, mediastinal contours, and pulmonary vascularity. Atherosclerotic calcification aorta. Scattered BILATERAL pulmonary infiltrates greater at bases unchanged. No pleural effusion or pneumothorax. Osseous structures unremarkable. IMPRESSION: Tip of RIGHT jugular line now projects over SVC. Persistent diffuse BILATERAL pulmonary infiltrates. Aortic Atherosclerosis (ICD10-I70.0). Electronically Signed   By: Ulyses Southward M.D.   On: 11/06/2020 14:33   ECHOCARDIOGRAM COMPLETE  Result Date: 11/07/2020    ECHOCARDIOGRAM REPORT   Patient Name:   Richard Campbell  Date of Exam: 11/07/2020 Medical Rec #:  [id number]  Height: Accession #:    [id number] Weight: Date of Birth:  05-Sep-1956  BSA: Patient Age:    64 years   BP:           114/60 mmHg Patient Gender: M          HR:  88 bpm. Exam Location:  Inpatient Procedure: 2D Echo, Cardiac Doppler and Color Doppler Indications:     CHF  History:         Patient has no prior history of Echocardiogram examinations.                  Arrythmias:Cardiac Arrest; Signs/Symptoms:Resp. failure.                  Pneumonia, Trach. collar.  Sonographer:     Lavenia Atlas RDCS Referring Phys:  [phone] ALI HIJAZI Diagnosing Phys: Orpah Cobb MD IMPRESSIONS  1. Left ventricular ejection fraction, by estimation, is 25 to 30%. The left ventricle has severely decreased function. The left ventricle demonstrates regional wall motion abnormalities (see scoring  diagram/findings for description). The left ventricular internal cavity size was mildly dilated. Left ventricular diastolic parameters are consistent with Grade I diastolic dysfunction (impaired relaxation). There is severe hypokinesis of the left ventricular, entire.  2. Right ventricular systolic function is mildly reduced. The right ventricular size is normal.  3. Left atrial size was moderately dilated.  4. Right atrial size was mildly dilated.  5. The mitral valve is degenerative. Moderate to severe mitral valve regurgitation.  6. The aortic valve is tricuspid. There is mild calcification of the aortic valve. There is mild thickening of the aortic valve. Aortic valve regurgitation is not visualized. Mild aortic valve sclerosis is present, with no evidence of aortic valve stenosis.  7. The inferior vena cava is dilated in size with <50% respiratory variability, suggesting right atrial pressure of 15 mmHg. FINDINGS  Left Ventricle: Left ventricular ejection fraction, by estimation, is 25 to 30%. The left ventricle has severely decreased function. The left ventricle demonstrates regional wall motion abnormalities. The left ventricular internal cavity size was mildly  dilated. There is no left ventricular hypertrophy. Left ventricular diastolic parameters are consistent with Grade I diastolic dysfunction (impaired relaxation).  LV Wall Scoring: The apical septal segment, apical anterior segment, apical inferior segment, and apex are akinetic. The anterior wall, antero-lateral wall, anterior septum, inferior wall, posterior wall, mid inferoseptal segment, and basal inferoseptal segment are hypokinetic. The apical lateral segment is normal. Right Ventricle: The right ventricular size is normal. No increase in right ventricular wall thickness. Right ventricular systolic function is mildly reduced. Left Atrium: Left atrial size was moderately dilated. Right Atrium: Right atrial size was mildly dilated. Pericardium:  There is no evidence of pericardial effusion. Mitral Valve: The mitral valve is degenerative in appearance. There is mild thickening of the mitral valve leaflet(s). There is mild calcification of the mitral valve leaflet(s). Normal mobility of the mitral valve leaflets. Mild mitral annular calcification. Moderate to severe mitral valve regurgitation. Tricuspid Valve: The tricuspid valve is normal in structure. Tricuspid valve regurgitation is mild. Aortic Valve: The aortic valve is tricuspid. There is mild calcification of the aortic valve. There is mild thickening of the aortic valve. There is mild aortic valve annular calcification. Aortic valve regurgitation is not visualized. Mild aortic valve sclerosis is present, with no evidence of aortic valve stenosis. Pulmonic Valve: The pulmonic valve was not well visualized. Pulmonic valve regurgitation is not visualized. Aorta: The aortic root is normal in size and structure. There is minimal (Grade I) atheroma plaque involving the aortic root and ascending aorta. Venous: The inferior vena cava is dilated in size with less than 50% respiratory variability, suggesting right atrial pressure of 15 mmHg. IAS/Shunts: The interatrial septum was not assessed.  LEFT VENTRICLE PLAX 2D  LVOT diam:     2.00 cm  Diastology LV SV:         50       LV e' medial:    3.48 cm/s LVOT Area:     3.14 cm LV E/e' medial:  16.1                         LV e' lateral:   13.80 cm/s                         LV E/e' lateral: 4.1  RIGHT VENTRICLE RV Basal diam:  2.60 cm RV S prime:     11.70 cm/s TAPSE (M-mode): 2.8 cm LEFT ATRIUM             RIGHT ATRIUM LA Vol (A2C):   69.8 ml RA Area:     15.30 cm LA Vol (A4C):   71.6 ml RA Volume:   40.30 ml LA Biplane Vol: 73.2 ml  AORTIC VALVE LVOT Vmax:   83.60 cm/s LVOT Vmean:  54.000 cm/s LVOT VTI:    0.159 m MITRAL VALVE MV Area (PHT): 2.69 cm    SHUNTS MV Decel Time: 282 msec    Systemic VTI:  0.16 m MV E velocity: 56.10 cm/s  Systemic Diam: 2.00 cm MV  A velocity: 55.70 cm/s MV E/A ratio:  1.01 Dixie Dials MD Electronically signed by Dixie Dials MD Signature Date/Time: 11/07/2020/4:43:08 PM    Final     Assessment/Plan Active Problems:   Acute on chronic respiratory failure with hypoxia (HCC)   Anoxic encephalopathy (HCC)   Cardiac arrest (HCC)   Aspiration pneumonia ([place])   1. Acute on chronic respiratory failure hypoxia we will continue with assist control titrate oxygen continue pulmonary toilet. 2. Anoxic encephalopathy no change 3. Cardiac arrest rhythm stable 4. Aspiration pneumonia has been treated we will continue to follow along   I have personally seen and evaluated the patient, evaluated laboratory and imaging results, formulated the assessment and plan and placed orders. The Patient requires high complexity decision making with multiple systems involvement.  Rounds were done with the Respiratory Therapy Director and Staff therapists and discussed with nursing staff also.  Allyne Gee, MD [facility] Pulmonary Critical Care Medicine Sleep Medicine

## 2020-11-09 DIAGNOSIS — G931 Anoxic brain damage, not elsewhere classified: Secondary | ICD-10-CM | POA: Diagnosis not present

## 2020-11-09 DIAGNOSIS — I469 Cardiac arrest, cause unspecified: Secondary | ICD-10-CM | POA: Diagnosis not present

## 2020-11-09 DIAGNOSIS — J9621 Acute and chronic respiratory failure with hypoxia: Secondary | ICD-10-CM | POA: Diagnosis not present

## 2020-11-09 DIAGNOSIS — J690 Pneumonitis due to inhalation of food and vomit: Secondary | ICD-10-CM | POA: Diagnosis not present

## 2020-11-09 NOTE — Progress Notes (Signed)
Pulmonary Critical Care Medicine [facility] [place]   PULMONARY CRITICAL CARE SERVICE  PROGRESS NOTE  Date of Service: 11/09/2020  Richard Campbell  ESP:[id number]  DOB: 1956/10/05   DOA: 10/19/2020  Referring Physician: Carron Curie, MD  HPI: Richard Campbell is a 65 y.o. male seen for follow up of Acute on Chronic Respiratory Failure. Currently on the ventilator full support and assist control mode has been on 35% FiO2 right now is on a PEEP of 5  Medications: Reviewed on Rounds  Physical Exam:  Vitals: Temperature is 98.3 pulse 84 respiratory 29 blood pressure is 116/73 saturations 100%  Ventilator Settings on assist control FiO2 is 35% tidal volume 500 with a PEEP of 5  . General: Comfortable at this time . Eyes: Grossly normal lids, irises & conjunctiva . ENT: grossly tongue is normal . Neck: no obvious mass . Cardiovascular: S1 S2 normal no gallop . Respiratory: No rhonchi very coarse breath sounds . Abdomen: soft . Skin: no rash seen on limited exam . Musculoskeletal: not rigid . Psychiatric:unable to assess . Neurologic: no seizure no involuntary movements         Lab Data:   Basic Metabolic Panel: Recent Labs  Lab 11/05/20 1527 11/08/20 0645  NA 138 138  K 3.2* 3.4*  CL 103 99  CO2 24 26  GLUCOSE 140* 126*  BUN 28* 22  CREATININE 0.84 0.83  CALCIUM 8.2* 8.7*    ABG: No results for input(s): PHART, PCO2ART, PO2ART, HCO3, O2SAT in the last 168 hours.  Liver Function Tests: Recent Labs  Lab 11/08/20 0645  AST 65*  ALT 52*  ALKPHOS 107  BILITOT 0.6  PROT 5.7*  ALBUMIN 2.1*   No results for input(s): LIPASE, AMYLASE in the last 168 hours. No results for input(s): AMMONIA in the last 168 hours.  CBC: Recent Labs  Lab 11/05/20 1527 11/08/20 0645  WBC 12.0* 7.4  HGB 8.4* 9.4*  HCT 26.7* 30.7*  MCV 92.7 92.5  PLT 380 348    Cardiac Enzymes: No results for input(s): CKTOTAL, CKMB, CKMBINDEX, TROPONINI in the last 168 hours.  BNP (last 3  results) Recent Labs    11/05/20 1527  BNP 1,432.6*    ProBNP (last 3 results) No results for input(s): PROBNP in the last 8760 hours.  Radiological Exams: ECHOCARDIOGRAM COMPLETE  Result Date: 11/07/2020    ECHOCARDIOGRAM REPORT   Patient Name:   Richard Campbell  Date of Exam: 11/07/2020 Medical Rec #:  [id number]  Height: Accession #:    [id number] Weight: Date of Birth:  [birth year]  BSA: Patient Age:    64 years   BP:           114/60 mmHg Patient Gender: M          HR:           88 bpm. Exam Location:  Inpatient Procedure: 2D Echo, Cardiac Doppler and Color Doppler Indications:     CHF  History:         Patient has no prior history of Echocardiogram examinations.                  Arrythmias:Cardiac Arrest; Signs/Symptoms:Resp. failure.                  Pneumonia, Trach. collar.  Sonographer:     Lavenia Atlas RDCS Referring Phys:  [phone] ALI HIJAZI Diagnosing Phys: Orpah Cobb MD IMPRESSIONS  1. Left ventricular ejection fraction, by estimation, is 25 to 30%. The  left ventricle has severely decreased function. The left ventricle demonstrates regional wall motion abnormalities (see scoring diagram/findings for description). The left ventricular internal cavity size was mildly dilated. Left ventricular diastolic parameters are consistent with Grade I diastolic dysfunction (impaired relaxation). There is severe hypokinesis of the left ventricular, entire.  2. Right ventricular systolic function is mildly reduced. The right ventricular size is normal.  3. Left atrial size was moderately dilated.  4. Right atrial size was mildly dilated.  5. The mitral valve is degenerative. Moderate to severe mitral valve regurgitation.  6. The aortic valve is tricuspid. There is mild calcification of the aortic valve. There is mild thickening of the aortic valve. Aortic valve regurgitation is not visualized. Mild aortic valve sclerosis is present, with no evidence of aortic valve stenosis.  7. The inferior vena cava is dilated  in size with <50% respiratory variability, suggesting right atrial pressure of 15 mmHg. FINDINGS  Left Ventricle: Left ventricular ejection fraction, by estimation, is 25 to 30%. The left ventricle has severely decreased function. The left ventricle demonstrates regional wall motion abnormalities. The left ventricular internal cavity size was mildly  dilated. There is no left ventricular hypertrophy. Left ventricular diastolic parameters are consistent with Grade I diastolic dysfunction (impaired relaxation).  LV Wall Scoring: The apical septal segment, apical anterior segment, apical inferior segment, and apex are akinetic. The anterior wall, antero-lateral wall, anterior septum, inferior wall, posterior wall, mid inferoseptal segment, and basal inferoseptal segment are hypokinetic. The apical lateral segment is normal. Right Ventricle: The right ventricular size is normal. No increase in right ventricular wall thickness. Right ventricular systolic function is mildly reduced. Left Atrium: Left atrial size was moderately dilated. Right Atrium: Right atrial size was mildly dilated. Pericardium: There is no evidence of pericardial effusion. Mitral Valve: The mitral valve is degenerative in appearance. There is mild thickening of the mitral valve leaflet(s). There is mild calcification of the mitral valve leaflet(s). Normal mobility of the mitral valve leaflets. Mild mitral annular calcification. Moderate to severe mitral valve regurgitation. Tricuspid Valve: The tricuspid valve is normal in structure. Tricuspid valve regurgitation is mild. Aortic Valve: The aortic valve is tricuspid. There is mild calcification of the aortic valve. There is mild thickening of the aortic valve. There is mild aortic valve annular calcification. Aortic valve regurgitation is not visualized. Mild aortic valve sclerosis is present, with no evidence of aortic valve stenosis. Pulmonic Valve: The pulmonic valve was not well visualized.  Pulmonic valve regurgitation is not visualized. Aorta: The aortic root is normal in size and structure. There is minimal (Grade I) atheroma plaque involving the aortic root and ascending aorta. Venous: The inferior vena cava is dilated in size with less than 50% respiratory variability, suggesting right atrial pressure of 15 mmHg. IAS/Shunts: The interatrial septum was not assessed.  LEFT VENTRICLE PLAX 2D LVOT diam:     2.00 cm  Diastology LV SV:         50       LV e' medial:    3.48 cm/s LVOT Area:     3.14 cm LV E/e' medial:  16.1                         LV e' lateral:   13.80 cm/s                         LV E/e' lateral: 4.1  RIGHT VENTRICLE RV Basal diam:  2.60 cm RV S prime:     11.70 cm/s TAPSE (M-mode): 2.8 cm LEFT ATRIUM             RIGHT ATRIUM LA Vol (A2C):   69.8 ml RA Area:     15.30 cm LA Vol (A4C):   71.6 ml RA Volume:   40.30 ml LA Biplane Vol: 73.2 ml  AORTIC VALVE LVOT Vmax:   83.60 cm/s LVOT Vmean:  54.000 cm/s LVOT VTI:    0.159 m MITRAL VALVE MV Area (PHT): 2.69 cm    SHUNTS MV Decel Time: 282 msec    Systemic VTI:  0.16 m MV E velocity: 56.10 cm/s  Systemic Diam: 2.00 cm MV A velocity: 55.70 cm/s MV E/A ratio:  1.01 Orpah Cobb MD Electronically signed by Orpah Cobb MD Signature Date/Time: 11/07/2020/4:43:08 PM    Final     Assessment/Plan Active Problems:   Acute on chronic respiratory failure with hypoxia (HCC)   Anoxic encephalopathy (HCC)   Cardiac arrest (HCC)   Aspiration pneumonia (HCC)   1. Acute on chronic respiratory failure hypoxia we'll continue with full support on the vent on assist control mode patient not doing well as far as being able to wean. 2. Anoxic encephalopathy no change 3. Cardiac arrest rhythm is stable 4. Aspiration pneumonia treated increased risk of recurrent   I have personally seen and evaluated the patient, evaluated laboratory and imaging results, formulated the assessment and plan and placed orders. The Patient requires high complexity  decision making with multiple systems involvement.  Rounds were done with the Respiratory Therapy Director and Staff therapists and discussed with nursing staff also.  Yevonne Pax, MD [facility] Pulmonary Critical Care Medicine Sleep Medicine

## 2020-11-10 DIAGNOSIS — J9621 Acute and chronic respiratory failure with hypoxia: Secondary | ICD-10-CM | POA: Diagnosis not present

## 2020-11-10 DIAGNOSIS — J690 Pneumonitis due to inhalation of food and vomit: Secondary | ICD-10-CM | POA: Diagnosis not present

## 2020-11-10 DIAGNOSIS — I469 Cardiac arrest, cause unspecified: Secondary | ICD-10-CM | POA: Diagnosis not present

## 2020-11-10 DIAGNOSIS — G931 Anoxic brain damage, not elsewhere classified: Secondary | ICD-10-CM | POA: Diagnosis not present

## 2020-11-10 LAB — BASIC METABOLIC PANEL
Anion gap: 15 (ref 5–15)
BUN: 29 mg/dL — ABNORMAL HIGH (ref 8–23)
CO2: 25 mmol/L (ref 22–32)
Calcium: 9.1 mg/dL (ref 8.9–10.3)
Chloride: 100 mmol/L (ref 98–111)
Creatinine, Ser: 0.93 mg/dL (ref 0.61–1.24)
GFR, Estimated: >60 mL/min (ref >60)
Glucose, Bld: 133 mg/dL — ABNORMAL HIGH (ref 70–99)
Potassium: 3.5 mmol/L (ref 3.5–5.1)
Sodium: 140 mmol/L (ref 135–145)

## 2020-11-10 NOTE — Progress Notes (Signed)
Pulmonary Critical Care Medicine [facility] [place]   PULMONARY CRITICAL CARE SERVICE  PROGRESS NOTE  Date of Service: 11/10/2020  Richard Campbell  HDQ:[id number]  DOB: 01/16/1956   DOA: 10/19/2020  Referring Physician: Carron Curie, MD  HPI: Richard Campbell is a 65 y.o. male seen for follow up of Acute on Chronic Respiratory Failure.  Patient is on the ventilator still no major changes no major improvement is noted patient still been requiring Versed  Medications: Reviewed on Rounds  Physical Exam:  Vitals: Temperature is 98.6 pulse 90 respiratory rate 35 blood pressure is 151/69 saturations 99%  Ventilator Settings on Versed drip assist control FiO2 40% tidal volume 500 PEEP 5  . General: Comfortable at this time . Eyes: Grossly normal lids, irises & conjunctiva . ENT: grossly tongue is normal . Neck: no obvious mass . Cardiovascular: S1 S2 normal no gallop . Respiratory: Scattered rhonchi expansion is equal . Abdomen: soft . Skin: no rash seen on limited exam . Musculoskeletal: not rigid . Psychiatric:unable to assess . Neurologic: no seizure no involuntary movements         Lab Data:   Basic Metabolic Panel: Recent Labs  Lab 11/05/20 1527 11/08/20 0645  NA 138 138  K 3.2* 3.4*  CL 103 99  CO2 24 26  GLUCOSE 140* 126*  BUN 28* 22  CREATININE 0.84 0.83  CALCIUM 8.2* 8.7*    ABG: No results for input(s): PHART, PCO2ART, PO2ART, HCO3, O2SAT in the last 168 hours.  Liver Function Tests: Recent Labs  Lab 11/08/20 0645  AST 65*  ALT 52*  ALKPHOS 107  BILITOT 0.6  PROT 5.7*  ALBUMIN 2.1*   No results for input(s): LIPASE, AMYLASE in the last 168 hours. No results for input(s): AMMONIA in the last 168 hours.  CBC: Recent Labs  Lab 11/05/20 1527 11/08/20 0645  WBC 12.0* 7.4  HGB 8.4* 9.4*  HCT 26.7* 30.7*  MCV 92.7 92.5  PLT 380 348    Cardiac Enzymes: No results for input(s): CKTOTAL, CKMB, CKMBINDEX, TROPONINI in the last 168 hours.  BNP  (last 3 results) Recent Labs    11/05/20 1527  BNP 1,432.6*    ProBNP (last 3 results) No results for input(s): PROBNP in the last 8760 hours.  Radiological Exams: No results found.  Assessment/Plan Active Problems:   Acute on chronic respiratory failure with hypoxia (HCC)   Anoxic encephalopathy (HCC)   Cardiac arrest (HCC)   Aspiration pneumonia (HCC)   1. Acute on chronic respiratory failure with hypoxia we will continue with full support on the ventilator.  Patient has failed attempts at weaning respiratory therapy will try once again once the Versed drip is back down 2. Anoxic encephalopathy no change 3. Cardiac arrest rhythm stable 4. Aspiration pneumonia has been treated with antibiotics remains at increased risk for aspiration   I have personally seen and evaluated the patient, evaluated laboratory and imaging results, formulated the assessment and plan and placed orders. The Patient requires high complexity decision making with multiple systems involvement.  Rounds were done with the Respiratory Therapy Director and Staff therapists and discussed with nursing staff also.  Yevonne Pax, MD [facility] Pulmonary Critical Care Medicine Sleep Medicine

## 2020-11-11 ENCOUNTER — Other Ambulatory Visit (HOSPITAL_COMMUNITY): Payer: Medicare (Managed Care)

## 2020-11-11 ENCOUNTER — Inpatient Hospital Stay (HOSPITAL_COMMUNITY)
Admission: RE | Admit: 2020-11-11 | Discharge: 2020-11-11 | Disposition: A | Discharge status: Home / Self Care | Payer: Medicare (Managed Care) | Source: Other Acute Inpatient Hospital | Attending: Internal Medicine | Admitting: Internal Medicine

## 2020-11-11 DIAGNOSIS — J690 Pneumonitis due to inhalation of food and vomit: Secondary | ICD-10-CM | POA: Diagnosis not present

## 2020-11-11 DIAGNOSIS — I469 Cardiac arrest, cause unspecified: Secondary | ICD-10-CM | POA: Diagnosis not present

## 2020-11-11 DIAGNOSIS — G931 Anoxic brain damage, not elsewhere classified: Secondary | ICD-10-CM | POA: Diagnosis not present

## 2020-11-11 DIAGNOSIS — J9621 Acute and chronic respiratory failure with hypoxia: Secondary | ICD-10-CM | POA: Diagnosis not present

## 2020-11-11 LAB — CULTURE, RESPIRATORY W GRAM STAIN

## 2020-11-11 NOTE — Progress Notes (Signed)
Pulmonary Critical Care Medicine [facility] [place]   PULMONARY CRITICAL CARE SERVICE  PROGRESS NOTE  Date of Service: 11/11/2020  Richard Campbell  VHQ:[id number]  DOB: 1956/04/19   DOA: 10/19/2020  Referring Physician: Carron Curie, MD  HPI: Richard Campbell is a 66 y.o. male seen for follow up of Acute on Chronic Respiratory Failure.  Patient is on full support right now will be trying pressure support weaning today  Medications: Reviewed on Rounds  Physical Exam:  Vitals: Temperature is 98.6 pulse 84 respiratory rate 30 blood pressure is 116/65 saturations 97  Ventilator Settings assist control FiO2 is 40% PEEP 5 tidal volume 500  . General: Comfortable at this time . Eyes: Grossly normal lids, irises & conjunctiva . ENT: grossly tongue is normal . Neck: no obvious mass . Cardiovascular: S1 S2 normal no gallop . Respiratory: Scattered rhonchi very coarse breath sounds . Abdomen: soft . Skin: no rash seen on limited exam . Musculoskeletal: not rigid . Psychiatric:unable to assess . Neurologic: no seizure no involuntary movements         Lab Data:   Basic Metabolic Panel: Recent Labs  Lab 11/05/20 1527 11/08/20 0645 11/10/20 1728  NA 138 138 140  K 3.2* 3.4* 3.5  CL 103 99 100  CO2 24 26 25   GLUCOSE 140* 126* 133*  BUN 28* 22 29*  CREATININE 0.84 0.83 0.93  CALCIUM 8.2* 8.7* 9.1    ABG: No results for input(s): PHART, PCO2ART, PO2ART, HCO3, O2SAT in the last 168 hours.  Liver Function Tests: Recent Labs  Lab 11/08/20 0645  AST 65*  ALT 52*  ALKPHOS 107  BILITOT 0.6  PROT 5.7*  ALBUMIN 2.1*   No results for input(s): LIPASE, AMYLASE in the last 168 hours. No results for input(s): AMMONIA in the last 168 hours.  CBC: Recent Labs  Lab 11/05/20 1527 11/08/20 0645  WBC 12.0* 7.4  HGB 8.4* 9.4*  HCT 26.7* 30.7*  MCV 92.7 92.5  PLT 380 348    Cardiac Enzymes: No results for input(s): CKTOTAL, CKMB, CKMBINDEX, TROPONINI in the last 168  hours.  BNP (last 3 results) Recent Labs    11/05/20 1527  BNP 1,432.6*    ProBNP (last 3 results) No results for input(s): PROBNP in the last 8760 hours.  Radiological Exams: DG CHEST PORT 1 VIEW  Result Date: 11/11/2020 CLINICAL DATA:  Pneumonia.  CHF EXAM: PORTABLE CHEST 1 VIEW COMPARISON:  Five days ago FINDINGS: Low volume chest with hazy opacity on the left more than right, similar or slightly improved from before. Central line with tip at the lower SVC. Tracheostomy tube in place. Artifact from EKG leads. Normal heart size. No visible effusion or pneumothorax IMPRESSION: Stable to mildly improved bilateral infiltrates. Electronically Signed   By: 01/09/2021 M.D.   On: 11/11/2020 06:44    Assessment/Plan Active Problems:   Acute on chronic respiratory failure with hypoxia (HCC)   Anoxic encephalopathy (HCC)   Cardiac arrest (HCC)   Aspiration pneumonia (HCC)   1. Acute on chronic respiratory failure with hypoxia we will continue with assist control mode currently on 40% FiO2 with a PEEP of 5 continue with secretion management supportive care. 2. Anoxic encephalopathy at baseline we will continue to follow 3. Cardiac arrest rhythm stable 4. Aspiration pneumonia is treated we will continue to follow along   I have personally seen and evaluated the patient, evaluated laboratory and imaging results, formulated the assessment and plan and placed orders. The Patient requires  high complexity decision making with multiple systems involvement.  Rounds were done with the Respiratory Therapy Director and Staff therapists and discussed with nursing staff also.  Allyne Gee, MD [facility] Pulmonary Critical Care Medicine Sleep Medicine

## 2020-11-11 NOTE — Progress Notes (Signed)
EEG completed, results pending. 

## 2020-11-11 NOTE — Procedures (Signed)
Patient Name: Richard Campbell  MRN: [id number]  Epilepsy Attending: Charlsie Quest  Referring Physician/Provider: Dr Florian Buff Date: 11/11/2020 Duration: 25.06 mins  Patient history: 64yo M with ams. EEG to evaluate for seizure  Level of alertness:  lethargic  AEDs during EEG study: None  Technical aspects: This EEG study was done with scalp electrodes positioned according to the 10-20 International system of electrode placement. Electrical activity was acquired at a sampling rate of 500Hz  and reviewed with a high frequency filter of 70Hz  and a low frequency filter of 1Hz . EEG data were recorded continuously and digitally stored.   Description:  EEG showed continuous generalized low amplitude 2-3hz  delta slowing. Hyperventilation and photic stimulation were not performed.     ABNORMALITY -Continuous slow, generalized  IMPRESSION: This study is suggestive of severe diffuse encephalopathy, nonspecific etiology. No seizures or epileptiform discharges were seen throughout the recording.  Quisha Mabie 

## 2020-11-12 DIAGNOSIS — I469 Cardiac arrest, cause unspecified: Secondary | ICD-10-CM | POA: Diagnosis not present

## 2020-11-12 DIAGNOSIS — J9621 Acute and chronic respiratory failure with hypoxia: Secondary | ICD-10-CM | POA: Diagnosis not present

## 2020-11-12 DIAGNOSIS — G931 Anoxic brain damage, not elsewhere classified: Secondary | ICD-10-CM | POA: Diagnosis not present

## 2020-11-12 DIAGNOSIS — J690 Pneumonitis due to inhalation of food and vomit: Secondary | ICD-10-CM | POA: Diagnosis not present

## 2020-11-12 LAB — CBC
HCT: 37.8 % — ABNORMAL LOW (ref 39.0–52.0)
Hemoglobin: 11.6 g/dL — ABNORMAL LOW (ref 13.0–17.0)
MCH: 28.2 pg (ref 26.0–34.0)
MCHC: 30.7 g/dL (ref 30.0–36.0)
MCV: 91.7 fL (ref 80.0–100.0)
Platelets: 371 10*3/uL (ref 150–400)
RBC: 4.12 MIL/uL — ABNORMAL LOW (ref 4.22–5.81)
RDW: 15.2 % (ref 11.5–15.5)
WBC: 8.1 10*3/uL (ref 4.0–10.5)
nRBC: 0 % (ref 0.0–0.2)

## 2020-11-12 LAB — BASIC METABOLIC PANEL
Anion gap: 11 (ref 5–15)
BUN: 38 mg/dL — ABNORMAL HIGH (ref 8–23)
CO2: 27 mmol/L (ref 22–32)
Calcium: 9.5 mg/dL (ref 8.9–10.3)
Chloride: 99 mmol/L (ref 98–111)
Creatinine, Ser: 1.03 mg/dL (ref 0.61–1.24)
GFR, Estimated: >60 mL/min (ref >60)
Glucose, Bld: 147 mg/dL — ABNORMAL HIGH (ref 70–99)
Potassium: 3.8 mmol/L (ref 3.5–5.1)
Sodium: 137 mmol/L (ref 135–145)

## 2020-11-12 LAB — MAGNESIUM: Magnesium: 2.2 mg/dL (ref 1.7–2.4)

## 2020-11-12 NOTE — Progress Notes (Signed)
Pulmonary Critical Care Medicine [facility] [place]   PULMONARY CRITICAL CARE SERVICE  PROGRESS NOTE  Date of Service: 11/12/2020  Richard Campbell  [id number]:[id number]  DOB: May 06, 1956   DOA: 10/19/2020  Referring Physician: Carron Curie, MD  HPI: Richard Campbell is a 65 y.o. male seen for follow up of Acute on Chronic Respiratory Failure.  Patient is currently on pressure support has been on 35% FiO2 patient did have an EEG which as expected showed diffuse slowing there were no seizure activities noted  Medications: Reviewed on Rounds  Physical Exam:  Vitals: Temperature 98.4 pulse 83 respiratory rate is 18 blood pressure is 139/77 saturations 100%  Ventilator Settings patient is currently on pressure support with an FiO2 of 35% pressure 12/5  . General: Comfortable at this time . Eyes: Grossly normal lids, irises & conjunctiva . ENT: grossly tongue is normal . Neck: no obvious mass . Cardiovascular: S1 S2 normal no gallop . Respiratory: No rhonchi no rales are noted at this time . Abdomen: soft . Skin: no rash seen on limited exam . Musculoskeletal: not rigid . Psychiatric:unable to assess . Neurologic: no seizure no involuntary movements         Lab Data:   Basic Metabolic Panel: Recent Labs  Lab 11/08/20 0645 11/10/20 1728 11/12/20 0419  NA 138 140 137  K 3.4* 3.5 3.8  CL 99 100 99  CO2 26 25 27   GLUCOSE 126* 133* 147*  BUN 22 29* 38*  CREATININE 0.83 0.93 1.03  CALCIUM 8.7* 9.1 9.5  MG  --   --  2.2    ABG: No results for input(s): PHART, PCO2ART, PO2ART, HCO3, O2SAT in the last 168 hours.  Liver Function Tests: Recent Labs  Lab 11/08/20 0645  AST 65*  ALT 52*  ALKPHOS 107  BILITOT 0.6  PROT 5.7*  ALBUMIN 2.1*   No results for input(s): LIPASE, AMYLASE in the last 168 hours. No results for input(s): AMMONIA in the last 168 hours.  CBC: Recent Labs  Lab 11/08/20 0645 11/12/20 0419  WBC 7.4 8.1  HGB 9.4* 11.6*  HCT 30.7* 37.8*  MCV 92.5  91.7  PLT 348 371    Cardiac Enzymes: No results for input(s): CKTOTAL, CKMB, CKMBINDEX, TROPONINI in the last 168 hours.  BNP (last 3 results) Recent Labs    11/05/20 1527  BNP 1,432.6*    ProBNP (last 3 results) No results for input(s): PROBNP in the last 8760 hours.  Radiological Exams: DG CHEST PORT 1 VIEW  Result Date: 11/11/2020 CLINICAL DATA:  Pneumonia.  CHF EXAM: PORTABLE CHEST 1 VIEW COMPARISON:  Five days ago FINDINGS: Low volume chest with hazy opacity on the left more than right, similar or slightly improved from before. Central line with tip at the lower SVC. Tracheostomy tube in place. Artifact from EKG leads. Normal heart size. No visible effusion or pneumothorax IMPRESSION: Stable to mildly improved bilateral infiltrates. Electronically Signed   By: 01/09/2021 M.D.   On: 11/11/2020 06:44   EEG adult  Result Date: 11/11/2020 01/09/2021, MD     11/11/2020  5:15 PM Patient Name: Richard Campbell MRN: Ilona Sorrel Epilepsy Attending: [id number] Referring Physician/Provider: Dr Charlsie Quest Date: 11/11/2020 Duration: 25.06 mins Patient history: 65yo M with ams. EEG to evaluate for seizure Level of alertness:  lethargic AEDs during EEG study: None Technical aspects: This EEG study was done with scalp electrodes positioned according to the 10-20 International system of electrode placement. Electrical activity was acquired  at a sampling rate of 500Hz  and reviewed with a high frequency filter of 70Hz  and a low frequency filter of 1Hz . EEG data were recorded continuously and digitally stored. Description:  EEG showed continuous generalized low amplitude 2-3hz  delta slowing. Hyperventilation and photic stimulation were not performed.   ABNORMALITY -Continuous slow, generalized IMPRESSION: This study is suggestive of severe diffuse encephalopathy, nonspecific etiology. No seizures or epileptiform discharges were seen throughout the recording. [name]     Assessment/Plan Active Problems:   Acute on chronic respiratory failure with hypoxia (HCC)   Anoxic encephalopathy (HCC)   Cardiac arrest (HCC)   Aspiration pneumonia (HCC)   1. Acute on chronic respiratory failure with hypoxia we will continue with pressure support 12/5 wean patient is still requiring heavy Versed sedation 2. Anoxic encephalopathy no change EEG results as noted showed severe diffuse encephalopathy without any active seizure activity. 3. Cardiac arrest overall prognosis guarded 4. Aspiration pneumonia has been treated with antibiotics   I have personally seen and evaluated the patient, evaluated laboratory and imaging results, formulated the assessment and plan and placed orders. The Patient requires high complexity decision making with multiple systems involvement.  Rounds were done with the Respiratory Therapy Director and Staff therapists and discussed with nursing staff also.  , MD [facility] Pulmonary Critical Care Medicine Sleep Medicine

## 2020-11-13 DIAGNOSIS — J9621 Acute and chronic respiratory failure with hypoxia: Secondary | ICD-10-CM | POA: Diagnosis not present

## 2020-11-13 DIAGNOSIS — I469 Cardiac arrest, cause unspecified: Secondary | ICD-10-CM | POA: Diagnosis not present

## 2020-11-13 DIAGNOSIS — G931 Anoxic brain damage, not elsewhere classified: Secondary | ICD-10-CM | POA: Diagnosis not present

## 2020-11-13 DIAGNOSIS — J690 Pneumonitis due to inhalation of food and vomit: Secondary | ICD-10-CM | POA: Diagnosis not present

## 2020-11-13 LAB — BLOOD GAS, ARTERIAL
Acid-Base Excess: 4.6 mmol/L — ABNORMAL HIGH (ref 0.0–2.0)
Bicarbonate: 27.8 mmol/L (ref 20.0–28.0)
FIO2: 28
O2 Saturation: 97.9 %
Patient temperature: 36.8
pCO2 arterial: 35.1 mmHg (ref 32.0–48.0)
pH, Arterial: 7.509 — ABNORMAL HIGH (ref 7.350–7.450)
pO2, Arterial: 95.8 mmHg (ref 83.0–108.0)

## 2020-11-13 NOTE — Progress Notes (Signed)
Pulmonary Critical Care Medicine [facility] [place]   PULMONARY CRITICAL CARE SERVICE  PROGRESS NOTE  Date of Service: 11/13/2020  Richard Campbell  ZWC:[id number]  DOB: 08-Sep-1956   DOA: 10/19/2020  Referring Physician: Carron Curie, MD  HPI: Richard Campbell is a 65 y.o. male seen for follow up of Acute on Chronic Respiratory Failure.  Patient remains off the ventilator currently is on 28% FiO2 will be completing 24 hours today  Medications: Reviewed on Rounds  Physical Exam:  Vitals: Temperature 98.6 pulse 83 respiratory rate 34 blood pressure is 154/82 saturations 96%  Ventilator Settings off the ventilator rate now on 28% FiO2  . General: Comfortable at this time . Eyes: Grossly normal lids, irises & conjunctiva . ENT: grossly tongue is normal . Neck: no obvious mass . Cardiovascular: S1 S2 normal no gallop . Respiratory: No rhonchi no rales . Abdomen: soft . Skin: no rash seen on limited exam . Musculoskeletal: not rigid . Psychiatric:unable to assess . Neurologic: no seizure no involuntary movements         Lab Data:   Basic Metabolic Panel: Recent Labs  Lab 11/08/20 0645 11/10/20 1728 11/12/20 0419  NA 138 140 137  K 3.4* 3.5 3.8  CL 99 100 99  CO2 26 25 27   GLUCOSE 126* 133* 147*  BUN 22 29* 38*  CREATININE 0.83 0.93 1.03  CALCIUM 8.7* 9.1 9.5  MG  --   --  2.2    ABG: Recent Labs  Lab 11/13/20 1230  PHART 7.509*  PCO2ART 35.1  PO2ART 95.8  HCO3 27.8  O2SAT 97.9    Liver Function Tests: Recent Labs  Lab 11/08/20 0645  AST 65*  ALT 52*  ALKPHOS 107  BILITOT 0.6  PROT 5.7*  ALBUMIN 2.1*   No results for input(s): LIPASE, AMYLASE in the last 168 hours. No results for input(s): AMMONIA in the last 168 hours.  CBC: Recent Labs  Lab 11/08/20 0645 11/12/20 0419  WBC 7.4 8.1  HGB 9.4* 11.6*  HCT 30.7* 37.8*  MCV 92.5 91.7  PLT 348 371    Cardiac Enzymes: No results for input(s): CKTOTAL, CKMB, CKMBINDEX, TROPONINI in the last  168 hours.  BNP (last 3 results) Recent Labs    11/05/20 1527  BNP 1,432.6*    ProBNP (last 3 results) No results for input(s): PROBNP in the last 8760 hours.  Radiological Exams: EEG adult  Result Date: 11/11/2020 01/09/2021, MD     11/11/2020  5:15 PM Patient Name: Richard Campbell MRN: Richard Campbell Epilepsy Attending: [id number] Referring Physician/Provider: Dr Charlsie Quest Date: 11/11/2020 Duration: 25.06 mins Patient history: 65yo M with ams. EEG to evaluate for seizure Level of alertness:  lethargic AEDs during EEG study: None Technical aspects: This EEG study was done with scalp electrodes positioned according to the 10-20 International system of electrode placement. Electrical activity was acquired at a sampling rate of 500Hz  and reviewed with a high frequency filter of 70Hz  and a low frequency filter of 1Hz . EEG data were recorded continuously and digitally stored. Description:  EEG showed continuous generalized low amplitude 2-3hz  delta slowing. Hyperventilation and photic stimulation were not performed.   ABNORMALITY -Continuous slow, generalized IMPRESSION: This study is suggestive of severe diffuse encephalopathy, nonspecific etiology. No seizures or epileptiform discharges were seen throughout the recording. Richard Campbell 65yo    Assessment/Plan Active Problems:   Acute on chronic respiratory failure with hypoxia (HCC)   Anoxic encephalopathy (HCC)   Cardiac arrest (HCC)  Aspiration pneumonia (HCC)   1. Acute on chronic respiratory failure with hypoxia plan is to continue with the T collar patient has completed 24 hours surprisingly looks about the same from a neurological perspective with really no response 2. Acute encephalopathy secondary to anoxic brain injury we will continue with supportive care 3. Cardiac arrest rhythm stable at this time 4. Aspiration pneumonia has been treated slowly improving   I have personally seen and evaluated the patient, evaluated  laboratory and imaging results, formulated the assessment and plan and placed orders. The Patient requires high complexity decision making with multiple systems involvement.  Rounds were done with the Respiratory Therapy Director and Staff therapists and discussed with nursing staff also.  Yevonne Pax, MD [facility] Pulmonary Critical Care Medicine Sleep Medicine

## 2020-11-14 DIAGNOSIS — J9621 Acute and chronic respiratory failure with hypoxia: Secondary | ICD-10-CM | POA: Diagnosis not present

## 2020-11-14 DIAGNOSIS — G931 Anoxic brain damage, not elsewhere classified: Secondary | ICD-10-CM | POA: Diagnosis not present

## 2020-11-14 DIAGNOSIS — J690 Pneumonitis due to inhalation of food and vomit: Secondary | ICD-10-CM | POA: Diagnosis not present

## 2020-11-14 DIAGNOSIS — I469 Cardiac arrest, cause unspecified: Secondary | ICD-10-CM | POA: Diagnosis not present

## 2020-11-14 NOTE — Progress Notes (Signed)
Pulmonary Critical Care Medicine [facility] [place]   PULMONARY CRITICAL CARE SERVICE  PROGRESS NOTE  Date of Service: 11/14/2020  Richard Campbell  [id number]  DOB: Jun 24, 1956   DOA: 10/19/2020  Referring Physician: Carron Curie, MD  HPI: Richard Campbell is a 65 y.o. male seen for follow up of Acute on Chronic Respiratory Failure. The patient currently is on T collar remains on 35% FiO2 has been off the ventilator now for 48 hours.  Medications: Reviewed on Rounds  Physical Exam:  Vitals: Temperature is 98.2 pulse 87 respiratory rate 33 blood pressure is 156/68 saturations 99%  Ventilator Settings currently on T collar with an FiO2 of 35% goal of 48 hours  . General: Comfortable at this time . Eyes: Grossly normal lids, irises & conjunctiva . ENT: grossly tongue is normal . Neck: no obvious mass . Cardiovascular: S1 S2 normal no gallop . Respiratory: Scattered rhonchi expansion is equal at this time . Abdomen: soft . Skin: no rash seen on limited exam . Musculoskeletal: not rigid . Psychiatric:unable to assess . Neurologic: no seizure no involuntary movements         Lab Data:   Basic Metabolic Panel: Recent Labs  Lab 11/08/20 0645 11/10/20 1728 11/12/20 0419  NA 138 140 137  K 3.4* 3.5 3.8  CL 99 100 99  CO2 26 25 27   GLUCOSE 126* 133* 147*  BUN 22 29* 38*  CREATININE 0.83 0.93 1.03  CALCIUM 8.7* 9.1 9.5  MG  --   --  2.2    ABG: Recent Labs  Lab 11/13/20 1230  PHART 7.509*  PCO2ART 35.1  PO2ART 95.8  HCO3 27.8  O2SAT 97.9    Liver Function Tests: Recent Labs  Lab 11/08/20 0645  AST 65*  ALT 52*  ALKPHOS 107  BILITOT 0.6  PROT 5.7*  ALBUMIN 2.1*   No results for input(s): LIPASE, AMYLASE in the last 168 hours. No results for input(s): AMMONIA in the last 168 hours.  CBC: Recent Labs  Lab 11/08/20 0645 11/12/20 0419  WBC 7.4 8.1  HGB 9.4* 11.6*  HCT 30.7* 37.8*  MCV 92.5 91.7  PLT 348 371    Cardiac Enzymes: No results  for input(s): CKTOTAL, CKMB, CKMBINDEX, TROPONINI in the last 168 hours.  BNP (last 3 results) Recent Labs    11/05/20 1527  BNP 1,432.6*    ProBNP (last 3 results) No results for input(s): PROBNP in the last 8760 hours.  Radiological Exams: No results found.  Assessment/Plan Active Problems:   Acute on chronic respiratory failure with hypoxia (HCC)   Anoxic encephalopathy (HCC)   Cardiac arrest (HCC)   Aspiration pneumonia (HCC)   1. Acute on chronic respiratory failure hypoxia we will continue with T collar as tolerated. Plan is to have a family meeting regarding goals of care 2. Anoxic encephalopathy no improvement 3. Cardiac arrest rhythm is stable at this time 4. Aspiration pneumonia has been treated we will continue to monitor.   I have personally seen and evaluated the patient, evaluated laboratory and imaging results, formulated the assessment and plan and placed orders. The Patient requires high complexity decision making with multiple systems involvement.  Rounds were done with the Respiratory Therapy Director and Staff therapists and discussed with nursing staff also.  01/03/21, MD [facility] Pulmonary Critical Care Medicine Sleep Medicine

## 2020-11-15 DIAGNOSIS — I469 Cardiac arrest, cause unspecified: Secondary | ICD-10-CM | POA: Diagnosis not present

## 2020-11-15 DIAGNOSIS — J9621 Acute and chronic respiratory failure with hypoxia: Secondary | ICD-10-CM | POA: Diagnosis not present

## 2020-11-15 DIAGNOSIS — J690 Pneumonitis due to inhalation of food and vomit: Secondary | ICD-10-CM | POA: Diagnosis not present

## 2020-11-15 DIAGNOSIS — G931 Anoxic brain damage, not elsewhere classified: Secondary | ICD-10-CM | POA: Diagnosis not present

## 2020-11-15 NOTE — Progress Notes (Signed)
Pulmonary Critical Care Medicine [facility] [place]   PULMONARY CRITICAL CARE SERVICE  PROGRESS NOTE  Date of Service: 11/15/2020  Richard Campbell  [id number]  DOB: November 14, 1955   DOA: 10/19/2020  Referring Physician: Carron Curie, MD  HPI: Richard Campbell is a 65 y.o. male seen for follow up of Acute on Chronic Respiratory Failure.  Patient at this time is on T collar has been on 20% FiO2 using PMV  Medications: Reviewed on Rounds  Physical Exam:  Vitals: Temperature is 97.4 pulse 75 respiratory 26 blood pressure is 108/62 saturations 99%  Ventilator Settings on T collar with a 28% FiO2  . General: Comfortable at this time . Eyes: Grossly normal lids, irises & conjunctiva . ENT: grossly tongue is normal . Neck: no obvious mass . Cardiovascular: S1 S2 normal no gallop . Respiratory: No rhonchi no rales are noted at this time . Abdomen: soft . Skin: no rash seen on limited exam . Musculoskeletal: not rigid . Psychiatric:unable to assess . Neurologic: no seizure no involuntary movements         Lab Data:   Basic Metabolic Panel: Recent Labs  Lab 11/10/20 1728 11/12/20 0419  NA 140 137  K 3.5 3.8  CL 100 99  CO2 25 27  GLUCOSE 133* 147*  BUN 29* 38*  CREATININE 0.93 1.03  CALCIUM 9.1 9.5  MG  --  2.2    ABG: Recent Labs  Lab 11/13/20 1230  PHART 7.509*  PCO2ART 35.1  PO2ART 95.8  HCO3 27.8  O2SAT 97.9    Liver Function Tests: No results for input(s): AST, ALT, ALKPHOS, BILITOT, PROT, ALBUMIN in the last 168 hours. No results for input(s): LIPASE, AMYLASE in the last 168 hours. No results for input(s): AMMONIA in the last 168 hours.  CBC: Recent Labs  Lab 11/12/20 0419  WBC 8.1  HGB 11.6*  HCT 37.8*  MCV 91.7  PLT 371    Cardiac Enzymes: No results for input(s): CKTOTAL, CKMB, CKMBINDEX, TROPONINI in the last 168 hours.  BNP (last 3 results) Recent Labs    11/05/20 1527  BNP 1,432.6*    ProBNP (last 3 results) No results for  input(s): PROBNP in the last 8760 hours.  Radiological Exams: No results found.  Assessment/Plan Active Problems:   Acute on chronic respiratory failure with hypoxia (HCC)   Anoxic encephalopathy (HCC)   Cardiac arrest (HCC)   Aspiration pneumonia (HCC)   1. Acute on chronic respiratory failure hypoxia we will continue with T-piece using PMV family is going to make decision regarding comfort measures patient is Poor prognosis overall 2. Acute encephalopathy no change secondary to anoxia 3. Cardiac arrest rhythm is stable right now 4. Aspiration pneumonia treated   I have personally seen and evaluated the patient, evaluated laboratory and imaging results, formulated the assessment and plan and placed orders. The Patient requires high complexity decision making with multiple systems involvement.  Rounds were done with the Respiratory Therapy Director and Staff therapists and discussed with nursing staff also.  Yevonne Pax, MD [facility] Pulmonary Critical Care Medicine Sleep Medicine

## 2020-11-16 DIAGNOSIS — J9621 Acute and chronic respiratory failure with hypoxia: Secondary | ICD-10-CM | POA: Diagnosis not present

## 2020-11-16 DIAGNOSIS — J690 Pneumonitis due to inhalation of food and vomit: Secondary | ICD-10-CM | POA: Diagnosis not present

## 2020-11-16 DIAGNOSIS — G931 Anoxic brain damage, not elsewhere classified: Secondary | ICD-10-CM | POA: Diagnosis not present

## 2020-11-16 DIAGNOSIS — I469 Cardiac arrest, cause unspecified: Secondary | ICD-10-CM | POA: Diagnosis not present

## 2020-11-16 NOTE — Progress Notes (Signed)
Pulmonary Critical Care Medicine [facility] [place]   PULMONARY CRITICAL CARE SERVICE  PROGRESS NOTE  Date of Service: 11/16/2020  Richard Campbell  [id number]:[id number]  DOB: 1955-12-09   DOA: 10/19/2020  Referring Physician: Carron Curie, MD  HPI: Richard Campbell is a 65 y.o. male seen for follow up of Acute on Chronic Respiratory Failure.  Patient currently is on T collar has been on 35% FiO2 with good saturation  Medications: Reviewed on Rounds  Physical Exam:  Vitals: Temperature is 98.0 pulse 86 respiratory rate 27 blood pressure is 127/74 saturations 96%  Ventilator Settings on T collar with an FiO2 of 35%  . General: Comfortable at this time . Eyes: Grossly normal lids, irises & conjunctiva . ENT: grossly tongue is normal . Neck: no obvious mass . Cardiovascular: S1 S2 normal no gallop . Respiratory: Scattered rhonchi expansion is equal . Abdomen: soft . Skin: no rash seen on limited exam . Musculoskeletal: not rigid . Psychiatric:unable to assess . Neurologic: no seizure no involuntary movements         Lab Data:   Basic Metabolic Panel: Recent Labs  Lab 11/10/20 1728 11/12/20 0419  NA 140 137  K 3.5 3.8  CL 100 99  CO2 25 27  GLUCOSE 133* 147*  BUN 29* 38*  CREATININE 0.93 1.03  CALCIUM 9.1 9.5  MG  --  2.2    ABG: Recent Labs  Lab 11/13/20 1230  PHART 7.509*  PCO2ART 35.1  PO2ART 95.8  HCO3 27.8  O2SAT 97.9    Liver Function Tests: No results for input(s): AST, ALT, ALKPHOS, BILITOT, PROT, ALBUMIN in the last 168 hours. No results for input(s): LIPASE, AMYLASE in the last 168 hours. No results for input(s): AMMONIA in the last 168 hours.  CBC: Recent Labs  Lab 11/12/20 0419  WBC 8.1  HGB 11.6*  HCT 37.8*  MCV 91.7  PLT 371    Cardiac Enzymes: No results for input(s): CKTOTAL, CKMB, CKMBINDEX, TROPONINI in the last 168 hours.  BNP (last 3 results) Recent Labs    11/05/20 1527  BNP 1,432.6*    ProBNP (last 3 results) No  results for input(s): PROBNP in the last 8760 hours.  Radiological Exams: No results found.  Assessment/Plan Active Problems:   Acute on chronic respiratory failure with hypoxia (HCC)   Anoxic encephalopathy (HCC)   Cardiac arrest (HCC)   Aspiration pneumonia (HCC)   1. Acute on chronic respiratory failure hypoxia we will continue with T collar trials patient right now is on 35% FiO2. 2. Anoxic encephalopathy grossly unchanged 3. Cardiac arrest rhythm stable 4. Aspiration pneumonia has been treated   I have personally seen and evaluated the patient, evaluated laboratory and imaging results, formulated the assessment and plan and placed orders. The Patient requires high complexity decision making with multiple systems involvement.  Rounds were done with the Respiratory Therapy Director and Staff therapists and discussed with nursing staff also.  Yevonne Pax, MD [facility] Pulmonary Critical Care Medicine Sleep Medicine

## 2020-11-18 DIAGNOSIS — G931 Anoxic brain damage, not elsewhere classified: Secondary | ICD-10-CM | POA: Diagnosis not present

## 2020-11-18 DIAGNOSIS — J9621 Acute and chronic respiratory failure with hypoxia: Secondary | ICD-10-CM | POA: Diagnosis not present

## 2020-11-18 DIAGNOSIS — J690 Pneumonitis due to inhalation of food and vomit: Secondary | ICD-10-CM | POA: Diagnosis not present

## 2020-11-18 DIAGNOSIS — I469 Cardiac arrest, cause unspecified: Secondary | ICD-10-CM | POA: Diagnosis not present

## 2020-11-18 NOTE — Progress Notes (Signed)
Pulmonary Critical Care Medicine [facility] [place]   PULMONARY CRITICAL CARE SERVICE  PROGRESS NOTE  Date of Service: 11/18/2020  Refugio Vandevoorde  XNA:[id number]  DOB: 22-Aug-1956   DOA: 10/19/2020  Referring Physician: Carron Curie, MD  HPI: Richard Campbell is a 65 y.o. male seen for follow up of Acute on Chronic Respiratory Failure.  Patient currently is on T collar has been on 35% FiO2 should be able to downsize to a #6 trach today however family is considering comfort measures  Medications: Reviewed on Rounds  Physical Exam:  Vitals: Temperature is 99.2 pulse 98 respiratory rate 22 blood pressure 106/64 saturations 94%  Ventilator Settings on T collar with an FiO2 of 35%  . General: Comfortable at this time . Eyes: Grossly normal lids, irises & conjunctiva . ENT: grossly tongue is normal . Neck: no obvious mass . Cardiovascular: S1 S2 normal no gallop . Respiratory: No rhonchi very coarse breath sound . Abdomen: soft . Skin: no rash seen on limited exam . Musculoskeletal: not rigid . Psychiatric:unable to assess . Neurologic: no seizure no involuntary movements         Lab Data:   Basic Metabolic Panel: Recent Labs  Lab 11/12/20 0419  NA 137  K 3.8  CL 99  CO2 27  GLUCOSE 147*  BUN 38*  CREATININE 1.03  CALCIUM 9.5  MG 2.2    ABG: Recent Labs  Lab 11/13/20 1230  PHART 7.509*  PCO2ART 35.1  PO2ART 95.8  HCO3 27.8  O2SAT 97.9    Liver Function Tests: No results for input(s): AST, ALT, ALKPHOS, BILITOT, PROT, ALBUMIN in the last 168 hours. No results for input(s): LIPASE, AMYLASE in the last 168 hours. No results for input(s): AMMONIA in the last 168 hours.  CBC: Recent Labs  Lab 11/12/20 0419  WBC 8.1  HGB 11.6*  HCT 37.8*  MCV 91.7  PLT 371    Cardiac Enzymes: No results for input(s): CKTOTAL, CKMB, CKMBINDEX, TROPONINI in the last 168 hours.  BNP (last 3 results) Recent Labs    11/05/20 1527  BNP 1,432.6*    ProBNP (last 3  results) No results for input(s): PROBNP in the last 8760 hours.  Radiological Exams: No results found.  Assessment/Plan Active Problems:   Acute on chronic respiratory failure with hypoxia (HCC)   Anoxic encephalopathy (HCC)   Cardiac arrest (HCC)   Aspiration pneumonia (HCC)   1. Acute on chronic respiratory failure with hypoxia we will continue with T collar titrate oxygen continue pulmonary toilet. 2. Anoxic encephalopathy no change we will continue to follow 3. Cardiac arrest rhythm stable 4. Aspiration pneumonia treated we will continue to follow   I have personally seen and evaluated the patient, evaluated laboratory and imaging results, formulated the assessment and plan and placed orders. The Patient requires high complexity decision making with multiple systems involvement.  Rounds were done with the Respiratory Therapy Director and Staff therapists and discussed with nursing staff also.  Yevonne Pax, MD [facility] Pulmonary Critical Care Medicine Sleep Medicine

## 2020-11-19 DIAGNOSIS — I469 Cardiac arrest, cause unspecified: Secondary | ICD-10-CM | POA: Diagnosis not present

## 2020-11-19 DIAGNOSIS — J690 Pneumonitis due to inhalation of food and vomit: Secondary | ICD-10-CM | POA: Diagnosis not present

## 2020-11-19 DIAGNOSIS — J9621 Acute and chronic respiratory failure with hypoxia: Secondary | ICD-10-CM | POA: Diagnosis not present

## 2020-11-19 DIAGNOSIS — G931 Anoxic brain damage, not elsewhere classified: Secondary | ICD-10-CM | POA: Diagnosis not present

## 2020-11-19 NOTE — Progress Notes (Signed)
Pulmonary Critical Care Medicine [facility] [place]   PULMONARY CRITICAL CARE SERVICE  PROGRESS NOTE  Date of Service: 11/19/2020  Richard Campbell  [id number]  DOB: 10/31/1956   DOA: 10/19/2020  Referring Physician: Carron Curie, MD  HPI: Richard Campbell is a 65 y.o. male seen for follow up of Acute on Chronic Respiratory Failure.  Patient is on T collar on 28% FiO2 good saturations are noted  Medications: Reviewed on Rounds  Physical Exam:  Vitals: Temperature is 99.8 pulse 104 respiratory rate 28 blood pressures 135/62 saturations 95%  Ventilator Settings on T collar with an FiO2 of 28%  . General: Comfortable at this time . Eyes: Grossly normal lids, irises & conjunctiva . ENT: grossly tongue is normal . Neck: no obvious mass . Cardiovascular: S1 S2 normal no gallop . Respiratory: No rhonchi very coarse breath . Abdomen: soft . Skin: no rash seen on limited exam . Musculoskeletal: not rigid . Psychiatric:unable to assess . Neurologic: no seizure no involuntary movements         Lab Data:   Basic Metabolic Panel: No results for input(s): NA, K, CL, CO2, GLUCOSE, BUN, CREATININE, CALCIUM, MG, PHOS in the last 168 hours.  ABG: Recent Labs  Lab 11/13/20 1230  PHART 7.509*  PCO2ART 35.1  PO2ART 95.8  HCO3 27.8  O2SAT 97.9    Liver Function Tests: No results for input(s): AST, ALT, ALKPHOS, BILITOT, PROT, ALBUMIN in the last 168 hours. No results for input(s): LIPASE, AMYLASE in the last 168 hours. No results for input(s): AMMONIA in the last 168 hours.  CBC: No results for input(s): WBC, NEUTROABS, HGB, HCT, MCV, PLT in the last 168 hours.  Cardiac Enzymes: No results for input(s): CKTOTAL, CKMB, CKMBINDEX, TROPONINI in the last 168 hours.  BNP (last 3 results) Recent Labs    11/05/20 1527  BNP 1,432.6*    ProBNP (last 3 results) No results for input(s): PROBNP in the last 8760 hours.  Radiological Exams: No results  found.  Assessment/Plan Active Problems:   Acute on chronic respiratory failure with hypoxia (HCC)   Anoxic encephalopathy (HCC)   Cardiac arrest (HCC)   Aspiration pneumonia (HCC)   1. Acute on chronic respiratory failure with hypoxia we will continue with T collar trials titrate oxygen continue pulmonary toilet. 2. Anoxic encephalopathy no change we will continue to follow 3. Cardiac arrest rhythm stable 4. Aspiration pneumonia has been treated slow improvement   I have personally seen and evaluated the patient, evaluated laboratory and imaging results, formulated the assessment and plan and placed orders. The Patient requires high complexity decision making with multiple systems involvement.  Rounds were done with the Respiratory Therapy Director and Staff therapists and discussed with nursing staff also.  Yevonne Pax, MD [facility] Pulmonary Critical Care Medicine Sleep Medicine

## 2020-11-20 DIAGNOSIS — J690 Pneumonitis due to inhalation of food and vomit: Secondary | ICD-10-CM | POA: Diagnosis not present

## 2020-11-20 DIAGNOSIS — G931 Anoxic brain damage, not elsewhere classified: Secondary | ICD-10-CM | POA: Diagnosis not present

## 2020-11-20 DIAGNOSIS — I469 Cardiac arrest, cause unspecified: Secondary | ICD-10-CM | POA: Diagnosis not present

## 2020-11-20 DIAGNOSIS — J9621 Acute and chronic respiratory failure with hypoxia: Secondary | ICD-10-CM | POA: Diagnosis not present

## 2020-11-20 NOTE — Progress Notes (Signed)
Pulmonary Critical Care Medicine [facility] [place]   PULMONARY CRITICAL CARE SERVICE  PROGRESS NOTE  Date of Service: 11/20/2020  Richard Campbell  [id number]  DOB: Feb 25, 1956   DOA: 10/19/2020  Referring Physician: Carron Curie, MD  HPI: Richard Campbell is a 65 y.o. male seen for follow up of Acute on Chronic Respiratory Failure.  Patient currently is on T collar has been on 28% FiO2 planning ongoing discharge with hospice  Medications: Reviewed on Rounds  Physical Exam:  Vitals: Temperature 98.2 pulse 159 respiratory 39 blood pressure is 93/63 saturations 95%  Ventilator Settings patient is on T collar currently on 28% FiO2  . General: Comfortable at this time . Eyes: Grossly normal lids, irises & conjunctiva . ENT: grossly tongue is normal . Neck: no obvious mass . Cardiovascular: S1 S2 normal no gallop . Respiratory: No rhonchi coarse breath sounds . Abdomen: soft . Skin: no rash seen on limited exam . Musculoskeletal: not rigid . Psychiatric:unable to assess . Neurologic: no seizure no involuntary movements         Lab Data:   Basic Metabolic Panel: No results for input(s): NA, K, CL, CO2, GLUCOSE, BUN, CREATININE, CALCIUM, MG, PHOS in the last 168 hours.  ABG: No results for input(s): PHART, PCO2ART, PO2ART, HCO3, O2SAT in the last 168 hours.  Liver Function Tests: No results for input(s): AST, ALT, ALKPHOS, BILITOT, PROT, ALBUMIN in the last 168 hours. No results for input(s): LIPASE, AMYLASE in the last 168 hours. No results for input(s): AMMONIA in the last 168 hours.  CBC: No results for input(s): WBC, NEUTROABS, HGB, HCT, MCV, PLT in the last 168 hours.  Cardiac Enzymes: No results for input(s): CKTOTAL, CKMB, CKMBINDEX, TROPONINI in the last 168 hours.  BNP (last 3 results) Recent Labs    11/05/20 1527  BNP 1,432.6*    ProBNP (last 3 results) No results for input(s): PROBNP in the last 8760 hours.  Radiological Exams: No results  found.  Assessment/Plan Active Problems:   Acute on chronic respiratory failure with hypoxia (HCC)   Anoxic encephalopathy (HCC)   Cardiac arrest (HCC)   Aspiration pneumonia (HCC)   1. Acute on chronic respiratory failure hypoxia we will continue with supportive care patient is going to be discharged with hospice 2. Encephalopathy no change anoxia 3. Cardiac arrest rhythm stable 4. Aspiration pneumonia no change   I have personally seen and evaluated the patient, evaluated laboratory and imaging results, formulated the assessment and plan and placed orders. The Patient requires high complexity decision making with multiple systems involvement.  Rounds were done with the Respiratory Therapy Director and Staff therapists and discussed with nursing staff also.  Yevonne Pax, MD [facility] Pulmonary Critical Care Medicine Sleep Medicine

## 2020-11-21 DIAGNOSIS — J9621 Acute and chronic respiratory failure with hypoxia: Secondary | ICD-10-CM | POA: Diagnosis not present

## 2020-11-21 DIAGNOSIS — J690 Pneumonitis due to inhalation of food and vomit: Secondary | ICD-10-CM | POA: Diagnosis not present

## 2020-11-21 DIAGNOSIS — I469 Cardiac arrest, cause unspecified: Secondary | ICD-10-CM | POA: Diagnosis not present

## 2020-11-21 DIAGNOSIS — G931 Anoxic brain damage, not elsewhere classified: Secondary | ICD-10-CM | POA: Diagnosis not present

## 2020-11-21 NOTE — Progress Notes (Signed)
Pulmonary Critical Care Medicine [facility] [place]   PULMONARY CRITICAL CARE SERVICE  PROGRESS NOTE  Date of Service: 11/21/2020  Richard Campbell  [id number]  DOB: 12-Dec-1955   DOA: 10/19/2020  Referring Physician: Carron Curie, MD  HPI: Richard Campbell is a 65 y.o. male seen for follow up of Acute on Chronic Respiratory Failure.  He is comfortable right now without distress will be going home with hospice likely  Medications: Reviewed on Rounds  Physical Exam:  Vitals: Temperature is 99.6 pulse 96 respiratory rate 30 blood pressure is 130/77 saturations 99  Ventilator Settings patient at on T collar on 28% FiO2  . General: Comfortable at this time . Eyes: Grossly normal lids, irises & conjunctiva . ENT: grossly tongue is normal . Neck: no obvious mass . Cardiovascular: S1 S2 normal no gallop . Respiratory: No rhonchi very coarse breath sounds . Abdomen: soft . Skin: no rash seen on limited exam . Musculoskeletal: not rigid . Psychiatric:unable to assess . Neurologic: no seizure no involuntary movements         Lab Data:   Basic Metabolic Panel: No results for input(s): NA, K, CL, CO2, GLUCOSE, BUN, CREATININE, CALCIUM, MG, PHOS in the last 168 hours.  ABG: No results for input(s): PHART, PCO2ART, PO2ART, HCO3, O2SAT in the last 168 hours.  Liver Function Tests: No results for input(s): AST, ALT, ALKPHOS, BILITOT, PROT, ALBUMIN in the last 168 hours. No results for input(s): LIPASE, AMYLASE in the last 168 hours. No results for input(s): AMMONIA in the last 168 hours.  CBC: No results for input(s): WBC, NEUTROABS, HGB, HCT, MCV, PLT in the last 168 hours.  Cardiac Enzymes: No results for input(s): CKTOTAL, CKMB, CKMBINDEX, TROPONINI in the last 168 hours.  BNP (last 3 results) Recent Labs    11/05/20 1527  BNP 1,432.6*    ProBNP (last 3 results) No results for input(s): PROBNP in the last 8760 hours.  Radiological Exams: No results  found.  Assessment/Plan Active Problems:   Acute on chronic respiratory failure with hypoxia (HCC)   Anoxic encephalopathy (HCC)   Cardiac arrest (HCC)   Aspiration pneumonia (HCC)   1. Acute on chronic respiratory failure hypoxia plan is to continue with T-piece titrate oxygen continue pulmonary toilet. 2. Anoxic encephalopathy grossly unchanged we will continue to follow along 3. Cardiac arrest rhythm stable 4. Aspiration pneumonia treated slowly improving   I have personally seen and evaluated the patient, evaluated laboratory and imaging results, formulated the assessment and plan and placed orders. The Patient requires high complexity decision making with multiple systems involvement.  Rounds were done with the Respiratory Therapy Director and Staff therapists and discussed with nursing staff also.  Yevonne Pax, MD [facility] Pulmonary Critical Care Medicine Sleep Medicine

## 2020-11-22 DIAGNOSIS — I469 Cardiac arrest, cause unspecified: Secondary | ICD-10-CM | POA: Diagnosis not present

## 2020-11-22 DIAGNOSIS — G931 Anoxic brain damage, not elsewhere classified: Secondary | ICD-10-CM | POA: Diagnosis not present

## 2020-11-22 DIAGNOSIS — J9621 Acute and chronic respiratory failure with hypoxia: Secondary | ICD-10-CM | POA: Diagnosis not present

## 2020-11-22 DIAGNOSIS — J690 Pneumonitis due to inhalation of food and vomit: Secondary | ICD-10-CM | POA: Diagnosis not present

## 2020-11-22 NOTE — Progress Notes (Signed)
Pulmonary Critical Care Medicine [facility] [place]   PULMONARY CRITICAL CARE SERVICE  PROGRESS NOTE  Date of Service: 11/22/2020  [name]  [id number]  DOB: 10-17-1956   DOA: 10/19/2020  Referring Physician: Carron Curie, MD  HPI: Richard Campbell is a 65 y.o. male seen for follow up of Acute on Chronic Respiratory Failure.  Patient is currently on T collar has been on 28% FiO2 with excellent saturation  Medications: Reviewed on Rounds  Physical Exam:  Vitals: Temperature is 97.4 pulse 108 respiratory 20 blood pressure is 100/55 saturations 96%  Ventilator Settings on T collar FiO2 28%  . General: Comfortable at this time . Eyes: Grossly normal lids, irises & conjunctiva . ENT: grossly tongue is normal . Neck: no obvious mass . Cardiovascular: S1 S2 normal no gallop . Respiratory: No rhonchi coarse breath sounds . Abdomen: soft . Skin: no rash seen on limited exam . Musculoskeletal: not rigid . Psychiatric:unable to assess . Neurologic: no seizure no involuntary movements         Lab Data:   Basic Metabolic Panel: No results for input(s): NA, K, CL, CO2, GLUCOSE, BUN, CREATININE, CALCIUM, MG, PHOS in the last 168 hours.  ABG: No results for input(s): PHART, PCO2ART, PO2ART, HCO3, O2SAT in the last 168 hours.  Liver Function Tests: No results for input(s): AST, ALT, ALKPHOS, BILITOT, PROT, ALBUMIN in the last 168 hours. No results for input(s): LIPASE, AMYLASE in the last 168 hours. No results for input(s): AMMONIA in the last 168 hours.  CBC: No results for input(s): WBC, NEUTROABS, HGB, HCT, MCV, PLT in the last 168 hours.  Cardiac Enzymes: No results for input(s): CKTOTAL, CKMB, CKMBINDEX, TROPONINI in the last 168 hours.  BNP (last 3 results) Recent Labs    11/05/20 1527  BNP 1,432.6*    ProBNP (last 3 results) No results for input(s): PROBNP in the last 8760 hours.  Radiological Exams: No results found.  Assessment/Plan Active  Problems:   Acute on chronic respiratory failure with hypoxia (HCC)   Anoxic encephalopathy (HCC)   Cardiac arrest (HCC)   Aspiration pneumonia (HCC)   1. Acute on chronic respiratory failure hypoxia remains on the T collar patient is on 28% FiO2 2. Anoxic encephalopathy no change 3. Cardiac arrest rhythm stable 4. Aspiration pneumonia has been treated   I have personally seen and evaluated the patient, evaluated laboratory and imaging results, formulated the assessment and plan and placed orders. The Patient requires high complexity decision making with multiple systems involvement.  Rounds were done with the Respiratory Therapy Director and Staff therapists and discussed with nursing staff also.  Yevonne Pax, MD [facility] Pulmonary Critical Care Medicine Sleep Medicine

## 2020-11-23 ENCOUNTER — Other Ambulatory Visit (HOSPITAL_COMMUNITY): Payer: Medicare (Managed Care)

## 2020-11-23 DIAGNOSIS — I469 Cardiac arrest, cause unspecified: Secondary | ICD-10-CM | POA: Diagnosis not present

## 2020-11-23 DIAGNOSIS — G931 Anoxic brain damage, not elsewhere classified: Secondary | ICD-10-CM | POA: Diagnosis not present

## 2020-11-23 DIAGNOSIS — J690 Pneumonitis due to inhalation of food and vomit: Secondary | ICD-10-CM | POA: Diagnosis not present

## 2020-11-23 DIAGNOSIS — J9621 Acute and chronic respiratory failure with hypoxia: Secondary | ICD-10-CM | POA: Diagnosis not present

## 2020-11-23 LAB — CBC
HCT: 39.5 % (ref 39.0–52.0)
Hemoglobin: 11.8 g/dL — ABNORMAL LOW (ref 13.0–17.0)
MCH: 27.3 pg (ref 26.0–34.0)
MCHC: 29.9 g/dL — ABNORMAL LOW (ref 30.0–36.0)
MCV: 91.2 fL (ref 80.0–100.0)
Platelets: 178 10*3/uL (ref 150–400)
RBC: 4.33 MIL/uL (ref 4.22–5.81)
RDW: 15.6 % — ABNORMAL HIGH (ref 11.5–15.5)
WBC: 15.8 10*3/uL — ABNORMAL HIGH (ref 4.0–10.5)
nRBC: 0 % (ref 0.0–0.2)

## 2020-11-23 NOTE — Progress Notes (Signed)
Pulmonary Critical Care Medicine [facility] [place]   PULMONARY CRITICAL CARE SERVICE  PROGRESS NOTE  Date of Service: 11/23/2020  Richard Campbell  [id number]  DOB: Jul 07, 1956   DOA: 10/19/2020  Referring Physician: Carron Curie, MD  HPI: Richard Campbell is a 65 y.o. male seen for follow up of Acute on Chronic Respiratory Failure.  Patient currently is on T collar has been on 28% FiO2 low-grade fevers noted  Medications: Reviewed on Rounds  Physical Exam:  Vitals: Temperature is 100.2 pulse 107 respiratory rate 38 blood pressure is 118/60 saturations 96%  Ventilator Settings on T collar with an FiO2 of 28%  . General: Comfortable at this time . Eyes: Grossly normal lids, irises & conjunctiva . ENT: grossly tongue is normal . Neck: no obvious mass . Cardiovascular: S1 S2 normal no gallop . Respiratory: Scattered rhonchi expansion .  Marland Kitchen Abdomen: soft . Skin: no rash seen on limited exam . Musculoskeletal: not rigid . Psychiatric:unable to assess . Neurologic: no seizure no involuntary movements         Lab Data:   Basic Metabolic Panel: No results for input(s): NA, K, CL, CO2, GLUCOSE, BUN, CREATININE, CALCIUM, MG, PHOS in the last 168 hours.  ABG: No results for input(s): PHART, PCO2ART, PO2ART, HCO3, O2SAT in the last 168 hours.  Liver Function Tests: No results for input(s): AST, ALT, ALKPHOS, BILITOT, PROT, ALBUMIN in the last 168 hours. No results for input(s): LIPASE, AMYLASE in the last 168 hours. No results for input(s): AMMONIA in the last 168 hours.  CBC: No results for input(s): WBC, NEUTROABS, HGB, HCT, MCV, PLT in the last 168 hours.  Cardiac Enzymes: No results for input(s): CKTOTAL, CKMB, CKMBINDEX, TROPONINI in the last 168 hours.  BNP (last 3 results) Recent Labs    11/05/20 1527  BNP 1,432.6*    ProBNP (last 3 results) No results for input(s): PROBNP in the last 8760 hours.  Radiological Exams: No results  found.  Assessment/Plan Active Problems:   Acute on chronic respiratory failure with hypoxia (HCC)   Anoxic encephalopathy (HCC)   Cardiac arrest (HCC)   Aspiration pneumonia (HCC)   1. Acute on chronic respiratory failure with hypoxia on T collar currently on 28% FiO2. 2. Anoxic encephalopathy no change 3. Cardiac arrest rhythm stable 4. Aspiration pneumonia has been treated   I have personally seen and evaluated the patient, evaluated laboratory and imaging results, formulated the assessment and plan and placed orders. The Patient requires high complexity decision making with multiple systems involvement.  Rounds were done with the Respiratory Therapy Director and Staff therapists and discussed with nursing staff also.  Yevonne Pax, MD [facility] Pulmonary Critical Care Medicine Sleep Medicine

## 2020-11-24 DIAGNOSIS — J690 Pneumonitis due to inhalation of food and vomit: Secondary | ICD-10-CM | POA: Diagnosis not present

## 2020-11-24 DIAGNOSIS — J9621 Acute and chronic respiratory failure with hypoxia: Secondary | ICD-10-CM | POA: Diagnosis not present

## 2020-11-24 DIAGNOSIS — G931 Anoxic brain damage, not elsewhere classified: Secondary | ICD-10-CM | POA: Diagnosis not present

## 2020-11-24 DIAGNOSIS — I469 Cardiac arrest, cause unspecified: Secondary | ICD-10-CM | POA: Diagnosis not present

## 2020-11-24 NOTE — Progress Notes (Signed)
Pulmonary Critical Care Medicine [facility] [place]   PULMONARY CRITICAL CARE SERVICE  PROGRESS NOTE  Date of Service: 11/24/2020  Mohammed Mcandrew  [id number]  DOB: [birth year]   DOA: 10/19/2020  Referring Physician: Carron Curie, MD  HPI: Kingson Lohmeyer is a 65 y.o. male seen for follow up of Acute on Chronic Respiratory Failure.  Patient at this time is off the ventilator on T collar family was apparently here yesterday for training possible discharge tomorrow with hospice care  Medications: Reviewed on Rounds  Physical Exam:  Vitals: Temperature is 97.9 pulse 101 respiratory rate is 30 blood pressure is 113/55 saturations 97  Ventilator Settings off the ventilator on T collar currently on 28% FiO2  . General: Comfortable at this time . Eyes: Grossly normal lids, irises & conjunctiva . ENT: grossly tongue is normal . Neck: no obvious mass . Cardiovascular: S1 S2 normal no gallop . Respiratory: Coarse breath sounds with a few scattered rhonchi . Abdomen: soft . Skin: no rash seen on limited exam . Musculoskeletal: not rigid . Psychiatric:unable to assess . Neurologic: no seizure no involuntary movements         Lab Data:   Basic Metabolic Panel: No results for input(s): NA, K, CL, CO2, GLUCOSE, BUN, CREATININE, CALCIUM, MG, PHOS in the last 168 hours.  ABG: No results for input(s): PHART, PCO2ART, PO2ART, HCO3, O2SAT in the last 168 hours.  Liver Function Tests: No results for input(s): AST, ALT, ALKPHOS, BILITOT, PROT, ALBUMIN in the last 168 hours. No results for input(s): LIPASE, AMYLASE in the last 168 hours. No results for input(s): AMMONIA in the last 168 hours.  CBC: Recent Labs  Lab 11/23/20 1445  WBC 15.8*  HGB 11.8*  HCT 39.5  MCV 91.2  PLT 178    Cardiac Enzymes: No results for input(s): CKTOTAL, CKMB, CKMBINDEX, TROPONINI in the last 168 hours.  BNP (last 3 results) Recent Labs    11/05/20 1527  BNP 1,432.6*    ProBNP (last 3  results) No results for input(s): PROBNP in the last 8760 hours.  Radiological Exams: DG CHEST PORT 1 VIEW  Result Date: 11/23/2020 CLINICAL DATA:  Pneumonia and fever. EXAM: PORTABLE CHEST 1 VIEW COMPARISON:  November 11, 2020 FINDINGS: The heart size and mediastinal contours are stable. Tracheostomy tube is unchanged. Right central venous line is unchanged. There is patchy consolidation of medial left lung base slightly worsened compared prior exam. The right lung is clear. There is no pleural effusion. The visualized skeletal structures are stable. IMPRESSION: Medial left lung base pneumonia slightly worsened compared prior exam. Electronically Signed   By: Sherian Rein M.D.   On: 11/23/2020 14:26    Assessment/Plan Active Problems:   Acute on chronic respiratory failure with hypoxia (HCC)   Anoxic encephalopathy (HCC)   Cardiac arrest (HCC)   Aspiration pneumonia (HCC)   1. Acute on chronic respiratory failure hypoxia we will continue with T collar home training for discharge tomorrow 2. Anoxic encephalopathy no change we will continue with supportive care 3. Cardiac arrest rhythm is stable 4. Aspiration pneumonia has been treated   I have personally seen and evaluated the patient, evaluated laboratory and imaging results, formulated the assessment and plan and placed orders. The Patient requires high complexity decision making with multiple systems involvement.  Rounds were done with the Respiratory Therapy Director and Staff therapists and discussed with nursing staff also.  Yevonne Pax, MD [facility] Pulmonary Critical Care Medicine Sleep Medicine

## 2020-11-25 DIAGNOSIS — J9621 Acute and chronic respiratory failure with hypoxia: Secondary | ICD-10-CM | POA: Diagnosis not present

## 2020-11-25 DIAGNOSIS — I469 Cardiac arrest, cause unspecified: Secondary | ICD-10-CM | POA: Diagnosis not present

## 2020-11-25 DIAGNOSIS — G931 Anoxic brain damage, not elsewhere classified: Secondary | ICD-10-CM | POA: Diagnosis not present

## 2020-11-25 DIAGNOSIS — J690 Pneumonitis due to inhalation of food and vomit: Secondary | ICD-10-CM | POA: Diagnosis not present

## 2020-11-25 NOTE — Progress Notes (Signed)
Pulmonary Critical Care Medicine [facility] [place]   PULMONARY CRITICAL CARE SERVICE  PROGRESS NOTE  Date of Service: 11/25/2020  Richard Campbell  [id number]  DOB: 1956/05/14   DOA: 10/19/2020  Referring Physician: Carron Curie, MD  HPI: Richard Campbell is a 65 y.o. male seen for follow up of Acute on Chronic Respiratory Failure.  Patient currently is on T collar has been on good saturations  Medications: Reviewed on Rounds  Physical Exam:  Vitals: Temperature is 97.7 pulse 104 respiratory 36 blood pressure is 145/79 saturations 100%  Ventilator Settings on T collar  . General: Comfortable at this time . Eyes: Grossly normal lids, irises & conjunctiva . ENT: grossly tongue is normal . Neck: no obvious mass . Cardiovascular: S1 S2 normal no gallop . Respiratory: No rhonchi coarse breath sounds . Abdomen: soft . Skin: no rash seen on limited exam . Musculoskeletal: not rigid . Psychiatric:unable to assess . Neurologic: no seizure no involuntary movements         Lab Data:   Basic Metabolic Panel: No results for input(s): NA, K, CL, CO2, GLUCOSE, BUN, CREATININE, CALCIUM, MG, PHOS in the last 168 hours.  ABG: No results for input(s): PHART, PCO2ART, PO2ART, HCO3, O2SAT in the last 168 hours.  Liver Function Tests: No results for input(s): AST, ALT, ALKPHOS, BILITOT, PROT, ALBUMIN in the last 168 hours. No results for input(s): LIPASE, AMYLASE in the last 168 hours. No results for input(s): AMMONIA in the last 168 hours.  CBC: Recent Labs  Lab 11/23/20 1445  WBC 15.8*  HGB 11.8*  HCT 39.5  MCV 91.2  PLT 178    Cardiac Enzymes: No results for input(s): CKTOTAL, CKMB, CKMBINDEX, TROPONINI in the last 168 hours.  BNP (last 3 results) Recent Labs    11/05/20 1527  BNP 1,432.6*    ProBNP (last 3 results) No results for input(s): PROBNP in the last 8760 hours.  Radiological Exams: DG CHEST PORT 1 VIEW  Result Date: 11/23/2020 CLINICAL DATA:   Pneumonia and fever. EXAM: PORTABLE CHEST 1 VIEW COMPARISON:  November 11, 2020 FINDINGS: The heart size and mediastinal contours are stable. Tracheostomy tube is unchanged. Right central venous line is unchanged. There is patchy consolidation of medial left lung base slightly worsened compared prior exam. The right lung is clear. There is no pleural effusion. The visualized skeletal structures are stable. IMPRESSION: Medial left lung base pneumonia slightly worsened compared prior exam. Electronically Signed   By: Sherian Rein M.D.   On: 11/23/2020 14:26    Assessment/Plan Active Problems:   Acute on chronic respiratory failure with hypoxia (HCC)   Anoxic encephalopathy (HCC)   Cardiac arrest (HCC)   Aspiration pneumonia (HCC)   1. Acute on chronic respiratory failure hypoxia patient is on T-piece we will continue with T-piece as ordered. 2. Encephalopathy no change secondary to anoxia 3. Cardiac arrest rhythm stable 4. Aspiration pneumonia has been treated with antibiotics still showing some basilar infiltrate   I have personally seen and evaluated the patient, evaluated laboratory and imaging results, formulated the assessment and plan and placed orders. The Patient requires high complexity decision making with multiple systems involvement.  Rounds were done with the Respiratory Therapy Director and Staff therapists and discussed with nursing staff also.  Yevonne Pax, MD [facility] Pulmonary Critical Care Medicine Sleep Medicine

## 2020-12-31 DEATH — deceased

## 2022-01-02 IMAGING — DX DG ABDOMEN 1V
2 series · 2 of 2 positions shown · non-contrast
Comparison: None.

CLINICAL DATA: Peg tube placement

EXAM:
ABDOMEN - 1 VIEW

[abdomen supine (1 of 2)]
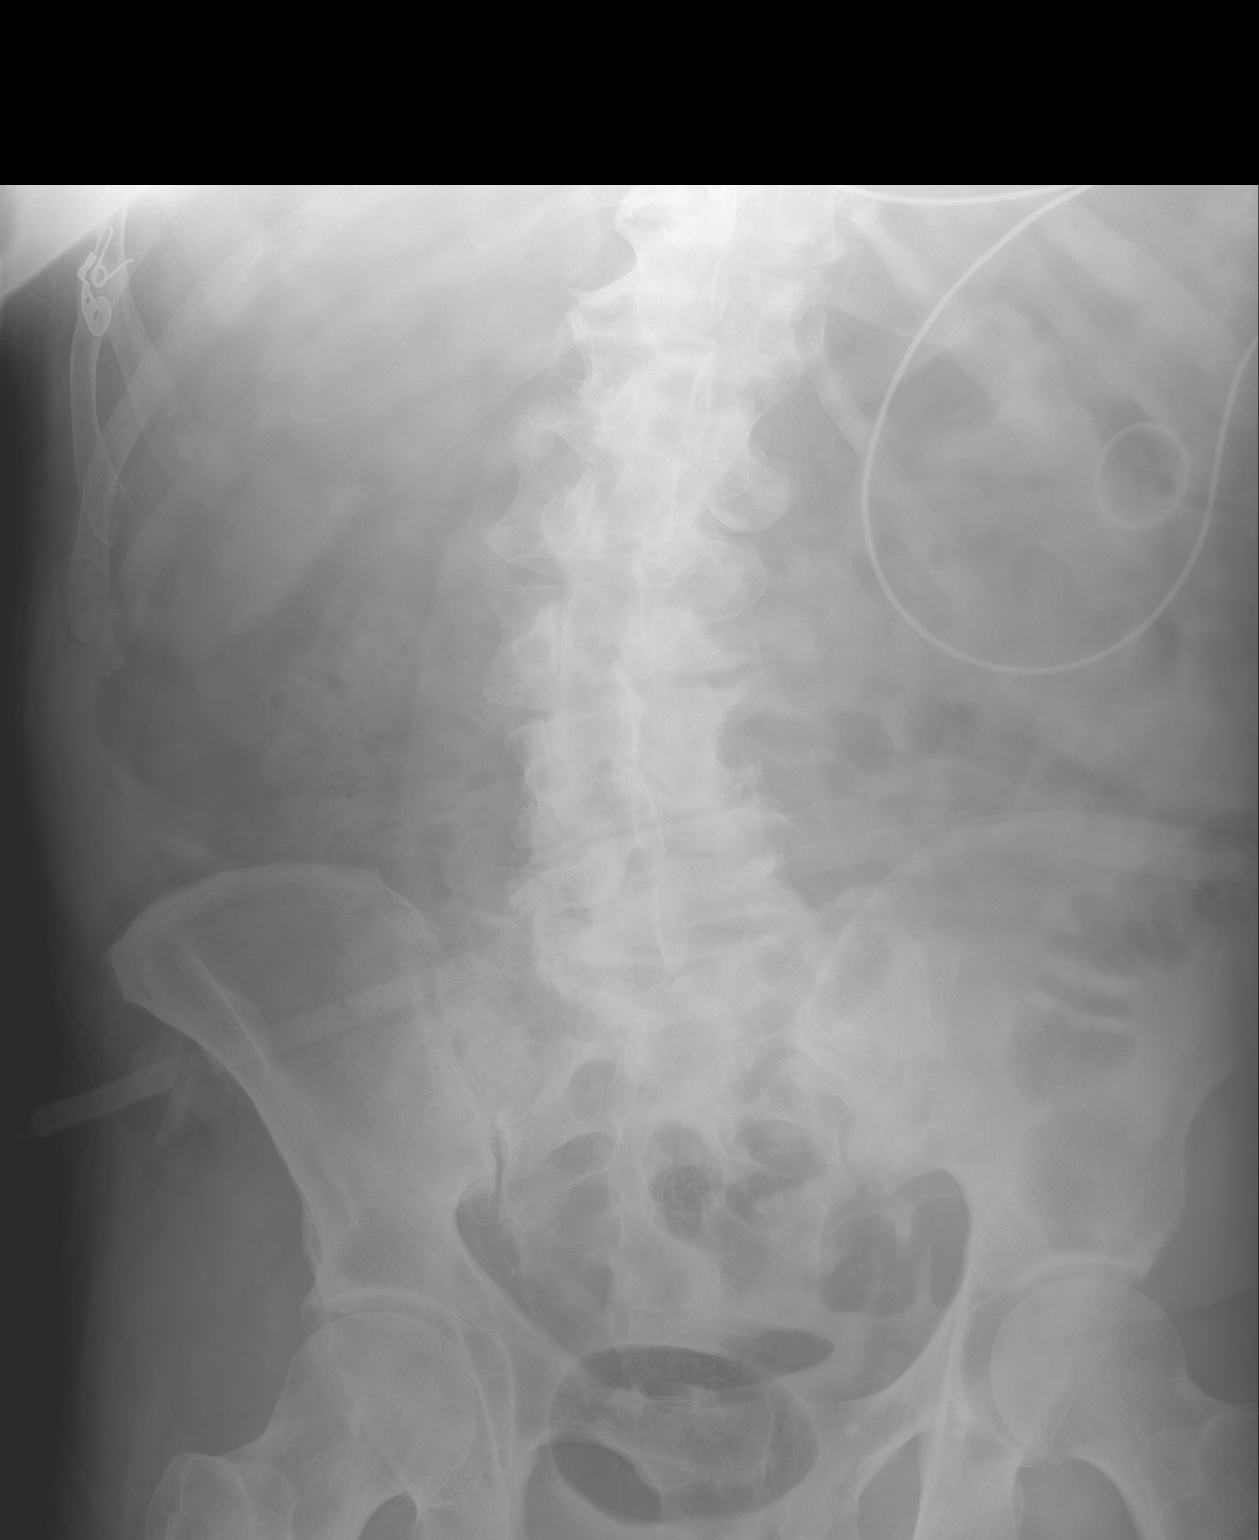

[abdomen supine (2 of 2)]
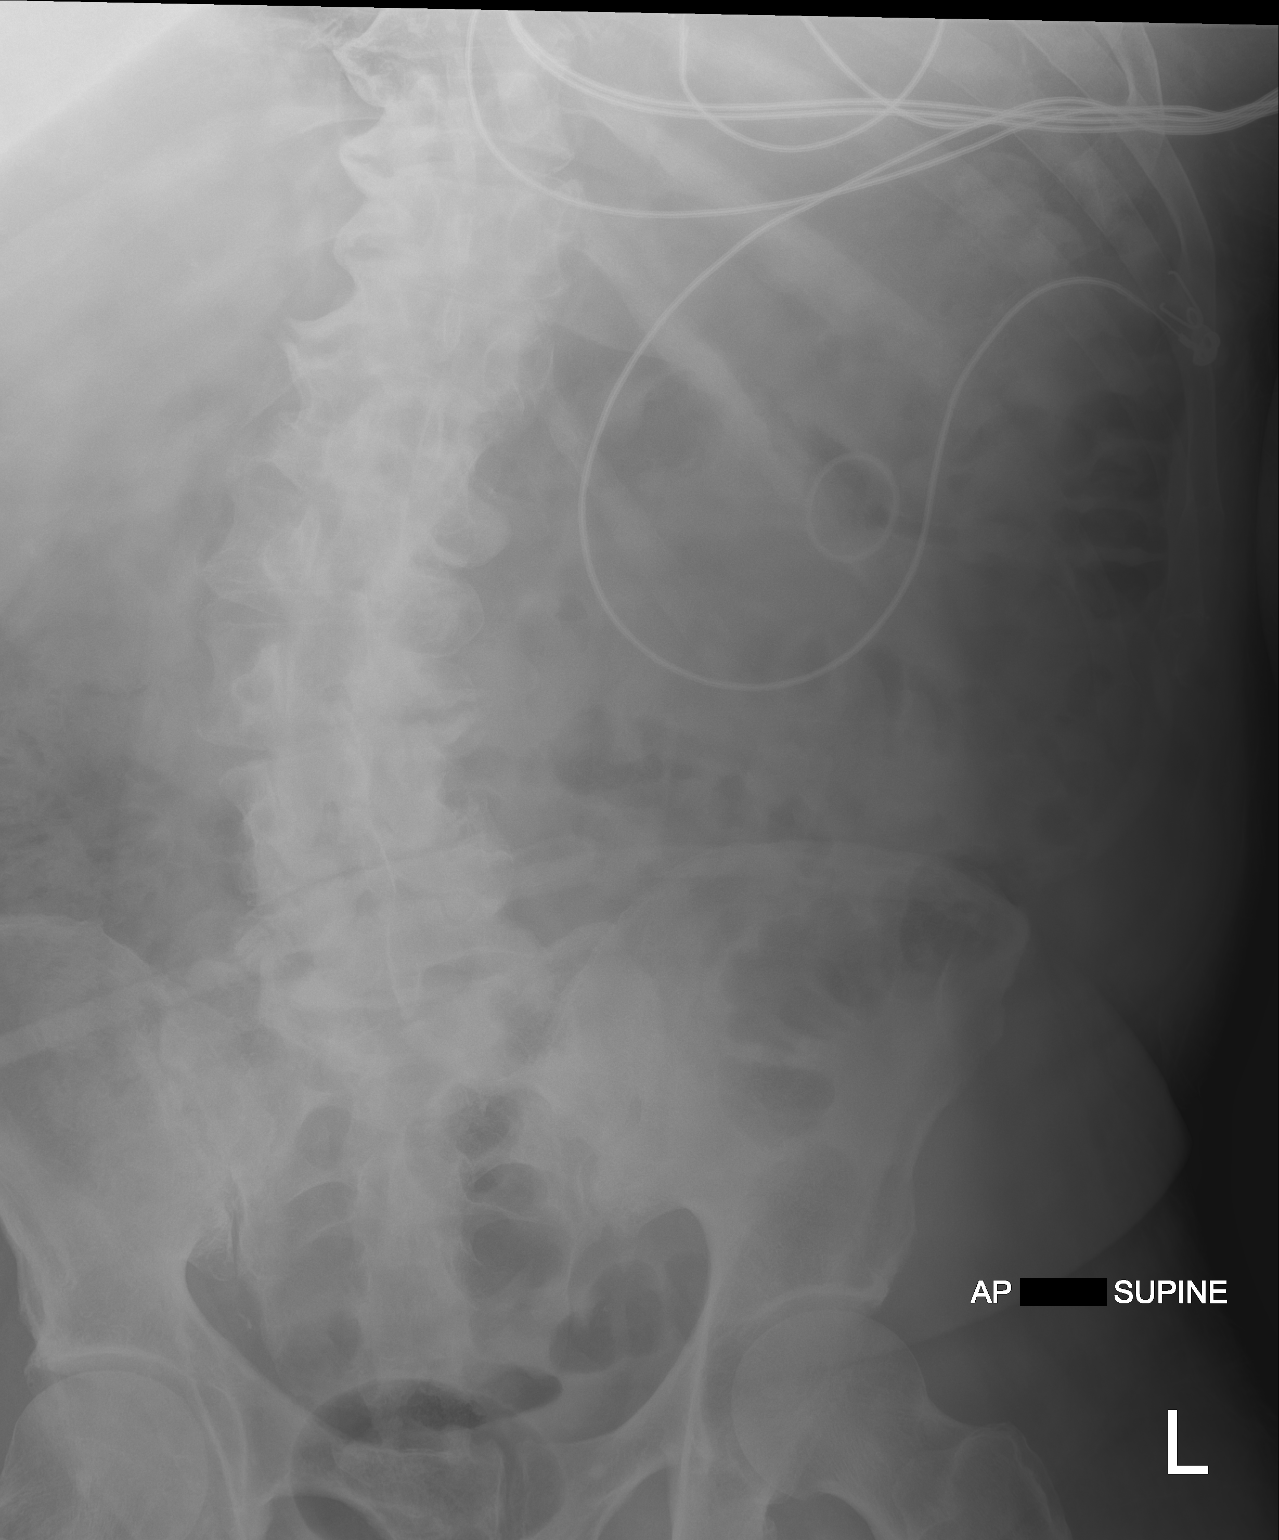

[2 of 2 positions shown; findings below may reference images not displayed]

FINDINGS: Air and stool-filled nondilated loops of bowel. Mild colonic stool
burden diffusely throughout the colon. G-tube projects over the LEFT
upper quadrant. Visualized lung bases are unremarkable. Degenerative
changes of the lumbar spine with dextroscoliosis of the lumbar
spine.
IMPRESSION: 1. Nonobstructive bowel gas pattern with mild colonic stool burden.
2. G-tube projects over the LEFT upper quadrant.

## 2022-01-02 IMAGING — DX DG CHEST 1V PORT
1 series · 1 of 1 positions shown · non-contrast
Comparison: None.

CLINICAL DATA: Respiratory failure.

EXAM:
PORTABLE CHEST 1 VIEW

[chest ap]
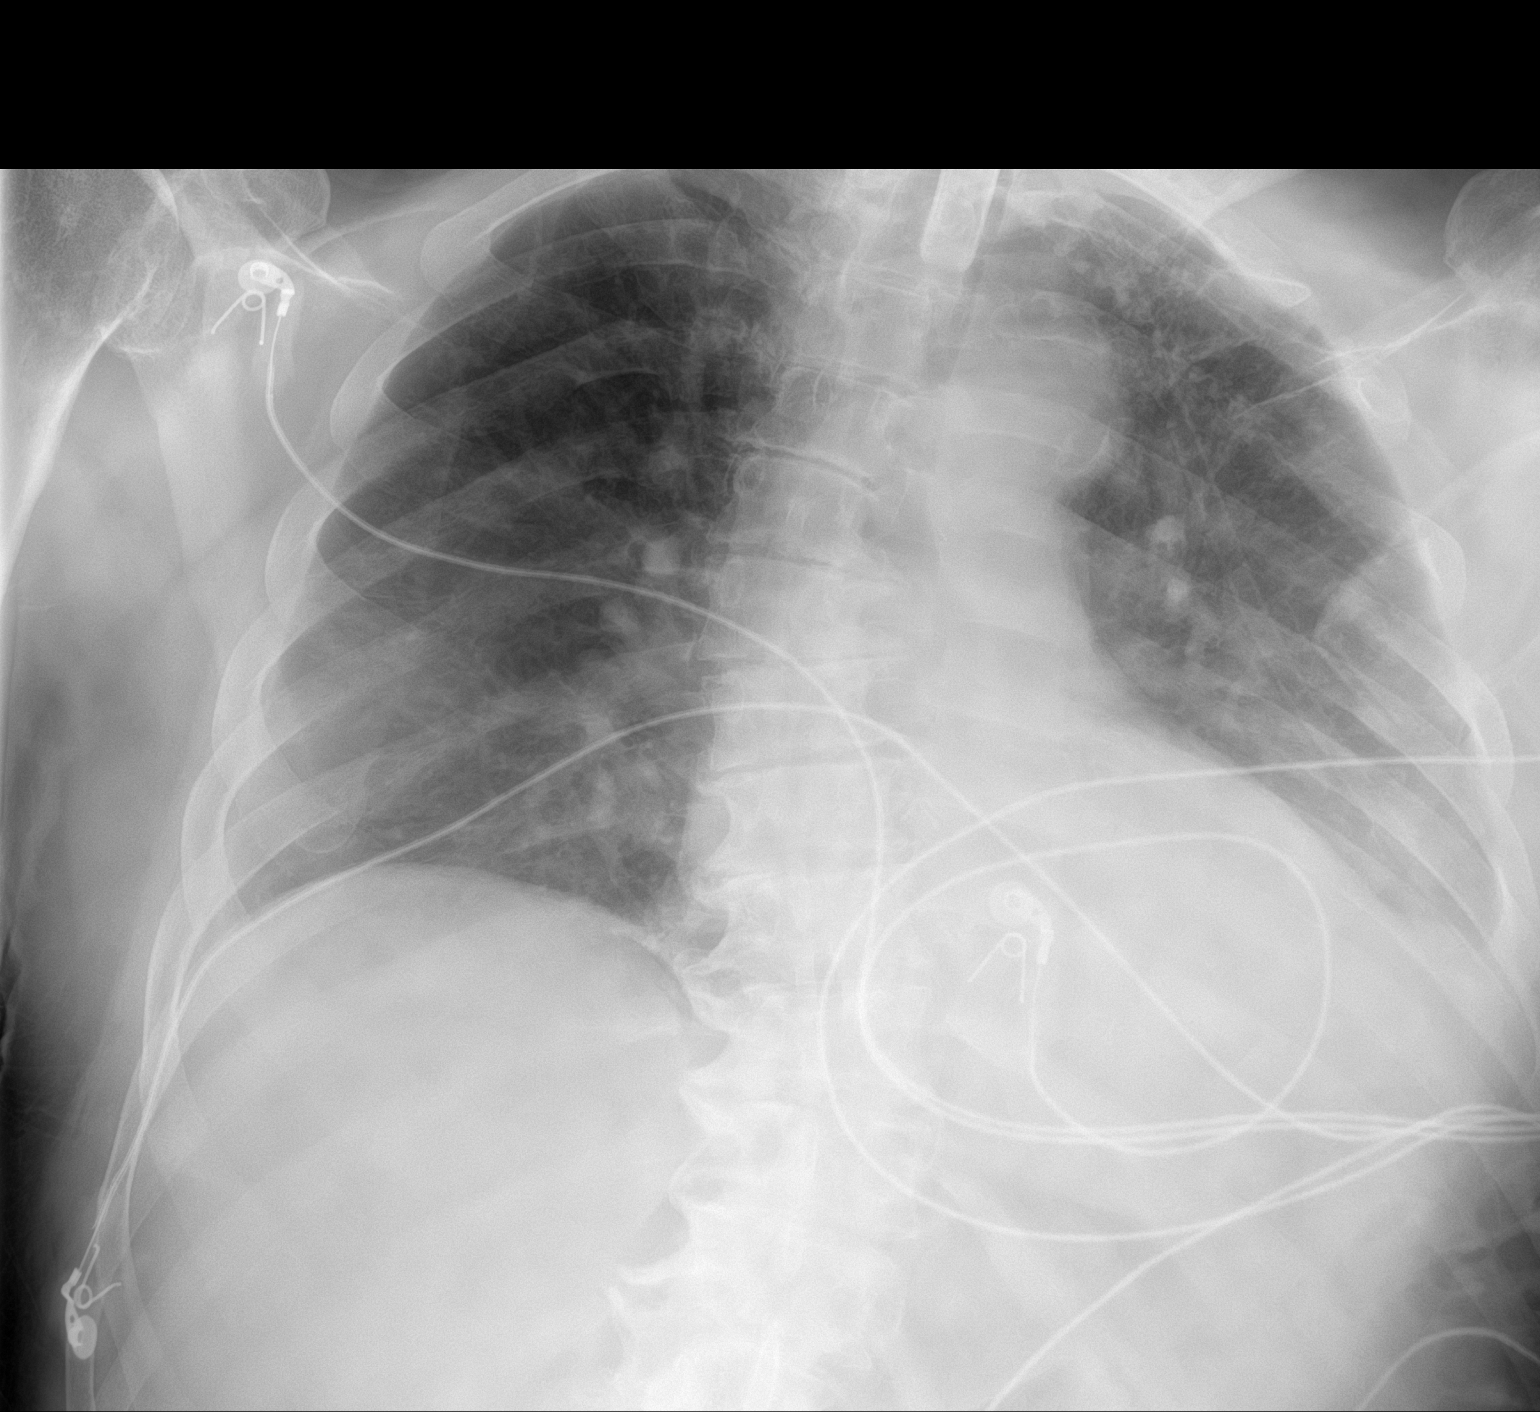

[1 of 1 positions shown; findings below may reference images not displayed]

FINDINGS: The cardiomediastinal silhouette is enlarged in
contour.Tracheostomy. Small LEFT pleural effusion. No pneumothorax.
LEFT retrocardiac opacity. Atherosclerotic calcifications of the
aorta. Visualized abdomen is unremarkable. Multilevel degenerative
changes of the thoracic spine.
IMPRESSION: Small LEFT pleural effusion with LEFT retrocardiac opacity, likely
atelectasis. Superimposed infection remains in the differential.

## 2022-01-08 IMAGING — DX DG CHEST 1V PORT
1 series · 1 of 1 positions shown · non-contrast
Comparison: 10/19/2020

CLINICAL DATA: Pneumonia.

EXAM:
PORTABLE CHEST 1 VIEW

[chest]
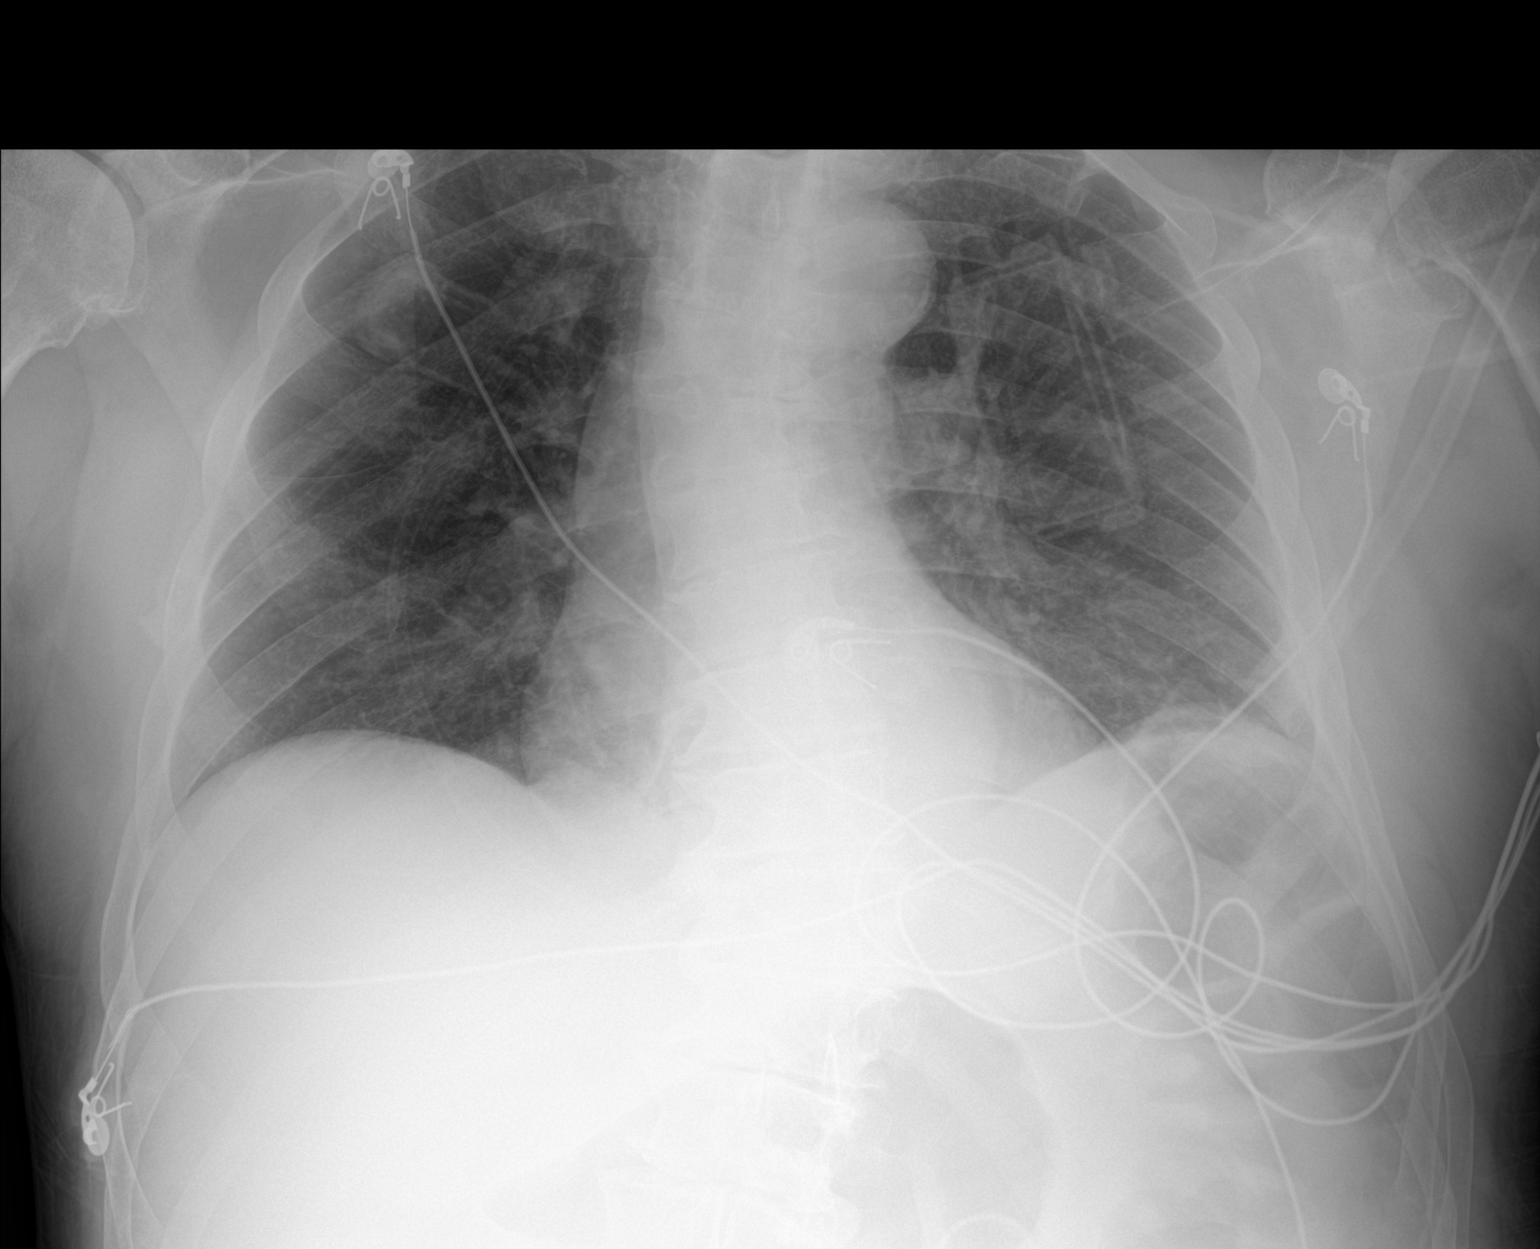

[1 of 1 positions shown; findings below may reference images not displayed]

FINDINGS: Tracheostomy tube in adequate position. Lungs are somewhat
hypoinflated demonstrate subtle patchy hazy perihilar opacification
which may be due to vascular congestion versus infection as findings
are slightly improved. No effusion. Cardiomediastinal silhouette and
remainder the exam is unchanged.
IMPRESSION: Slight interval improvement of subtle patchy hazy perihilar
opacification which may be due to vascular congestion versus
infection.

## 2022-01-13 IMAGING — DX DG CHEST 1V
1 series · 1 of 1 positions shown · non-contrast
Comparison: 10/29/2020

CLINICAL DATA: Respiratory failure

EXAM:
CHEST  1 VIEW

[chest]
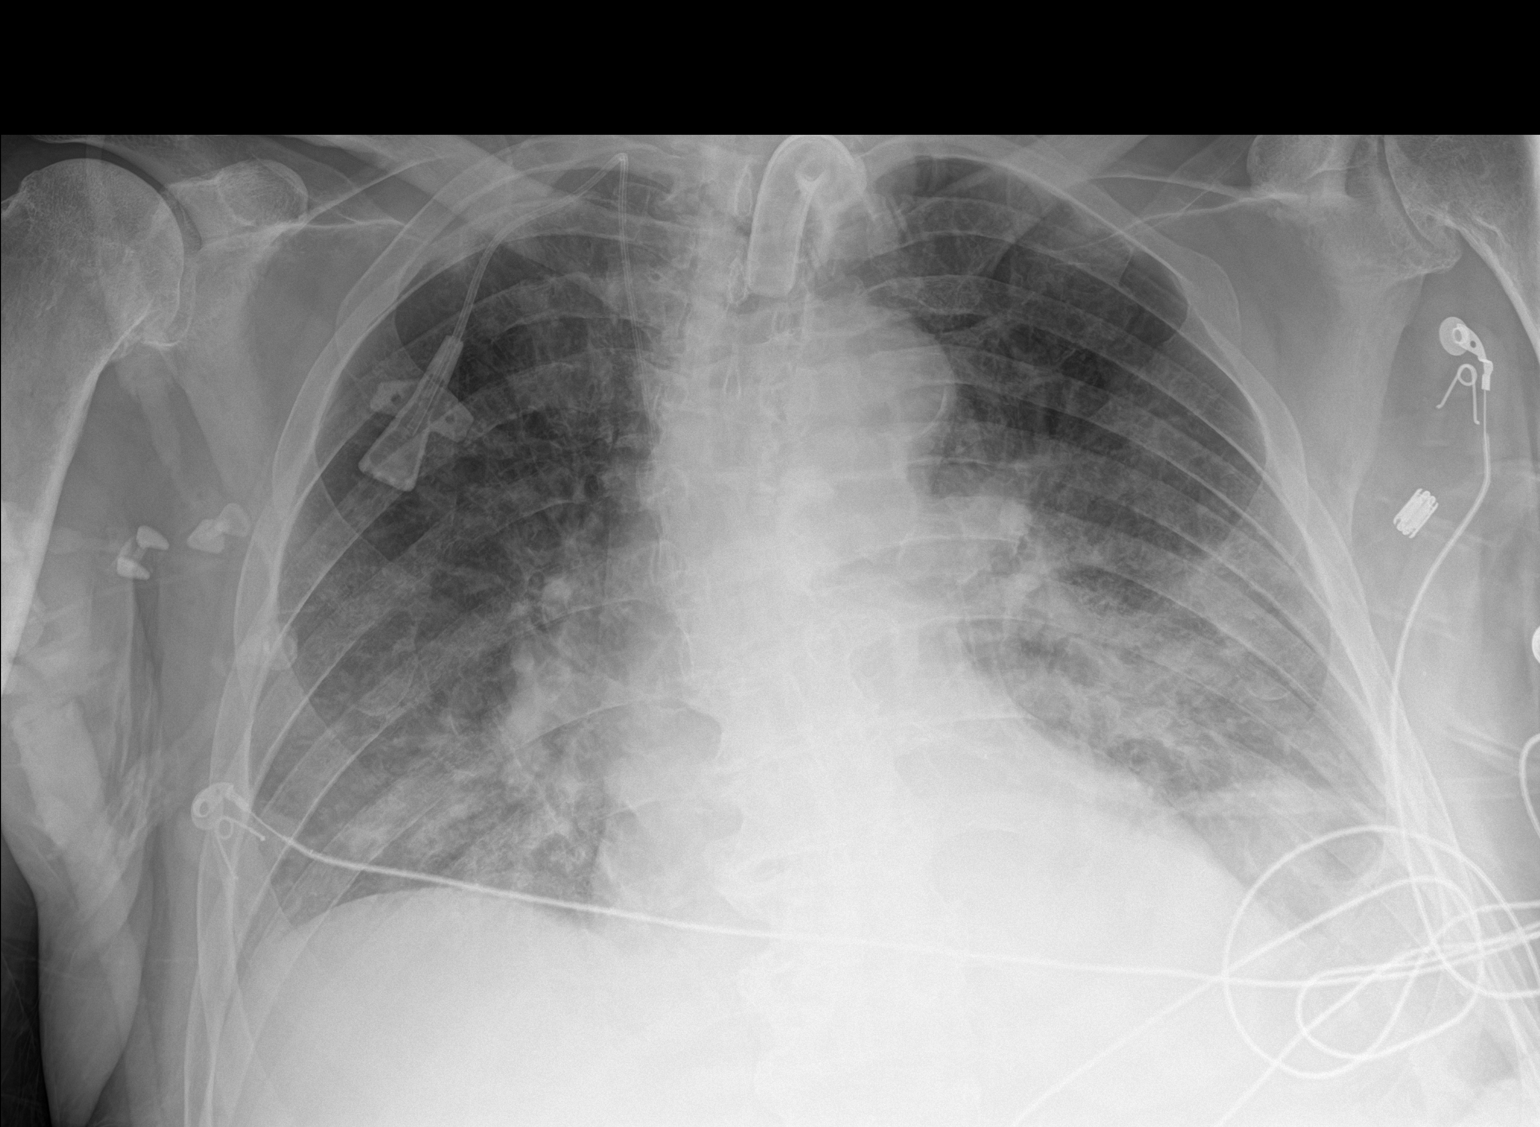

[1 of 1 positions shown; findings below may reference images not displayed]

FINDINGS: Tracheostomy and right internal jugular central venous catheter with
its tip within the superior vena cava are unchanged. Pulmonary
insufflation is stable, though lung volumes are slightly small.
Superimposed extensive bilateral perihilar and lower lung zone
pulmonary infiltrate persists, slightly progressive at the right
lung base, likely infectious or inflammatory in etiology. No
pneumothorax or pleural effusion. Cardiac size within normal limits.
Pulmonary vascularity normal. No acute bone abnormality.
IMPRESSION: Stable support tubes and lines.

Preserved pulmonary insufflation.

Progressive bibasilar pulmonary infiltrate, likely infectious in the
appropriate clinical setting.

## 2022-01-25 IMAGING — DX DG CHEST 1V PORT
1 series · 1 of 1 positions shown · non-contrast
Comparison: Five days ago

CLINICAL DATA: Pneumonia.  CHF

EXAM:
PORTABLE CHEST 1 VIEW

[chest]
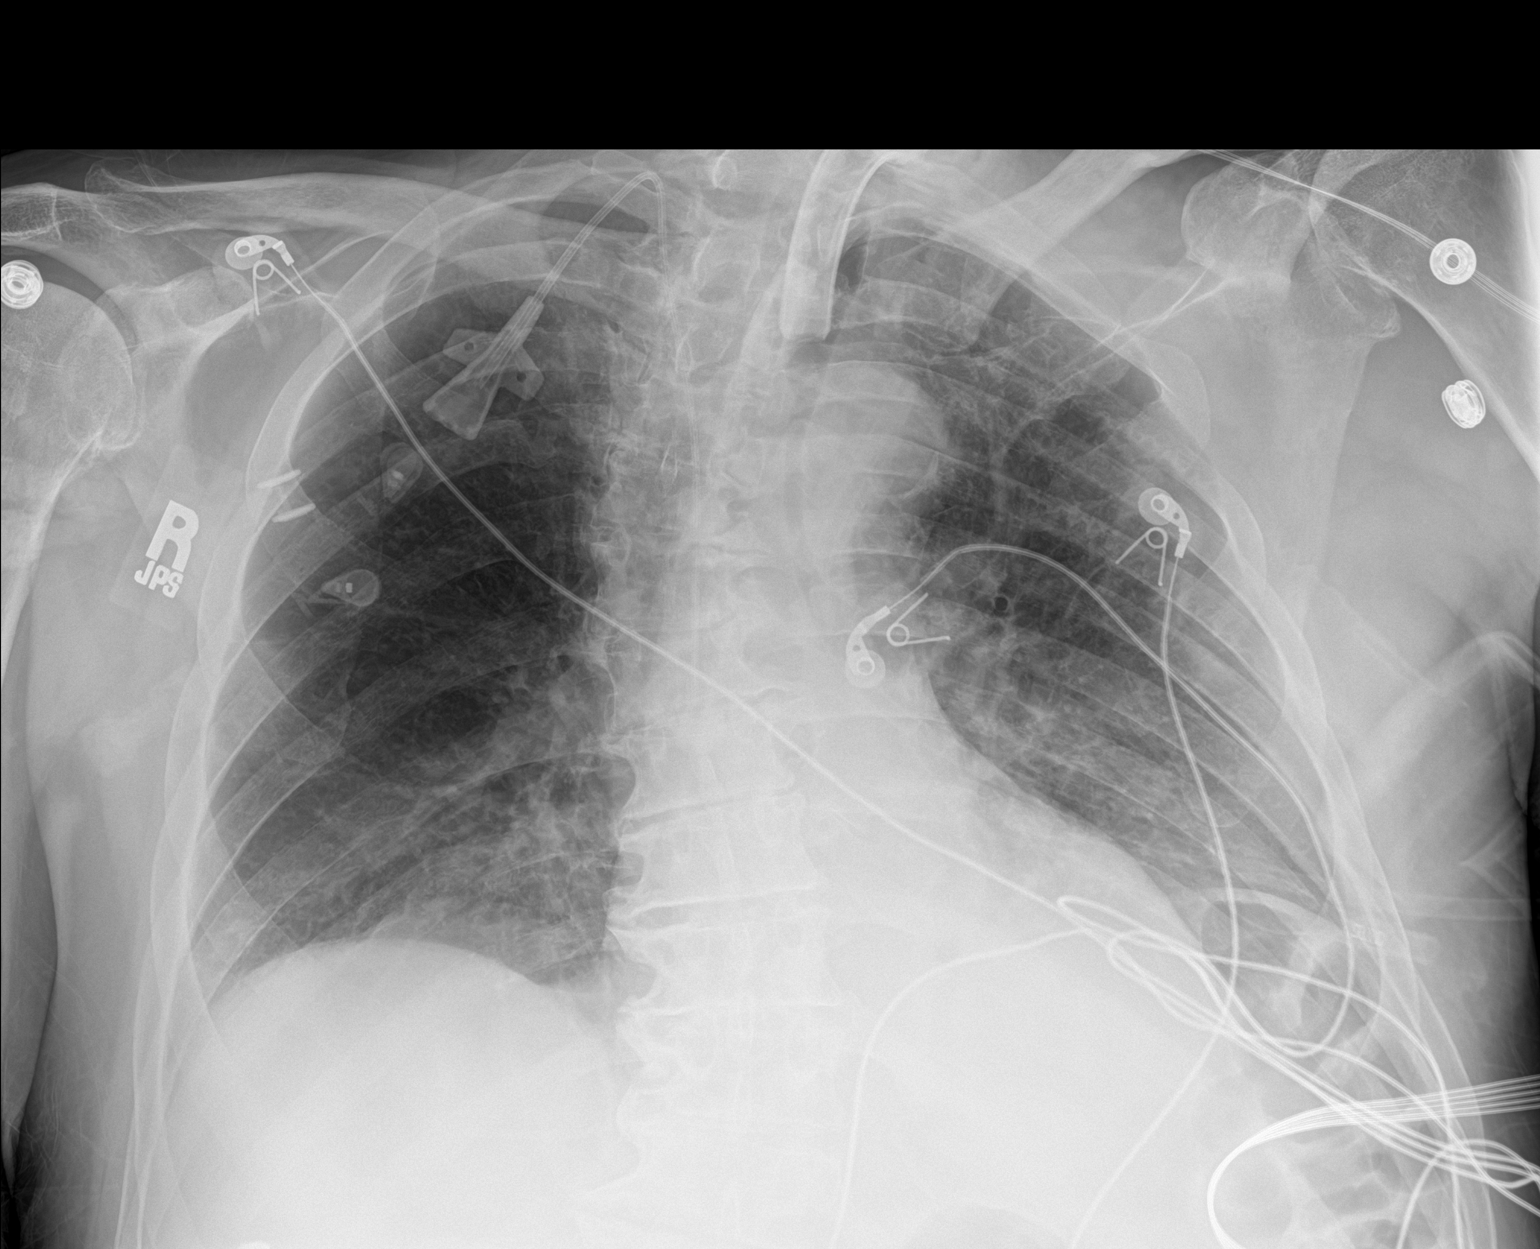

[1 of 1 positions shown; findings below may reference images not displayed]

FINDINGS: Low volume chest with hazy opacity on the left more than right,
similar or slightly improved from before. Central line with tip at
the lower SVC. Tracheostomy tube in place. Artifact from EKG leads.
Normal heart size. No visible effusion or pneumothorax
IMPRESSION: Stable to mildly improved bilateral infiltrates.
